# Patient Record
Sex: Male | Born: 1935 | Race: White | Hispanic: No | Marital: Married | Smoking: Never smoker
Health system: Southern US, Community
[De-identification: ages and names within clinical notes are randomized; demographics above are authoritative.]

## PROBLEM LIST (undated history)

## (undated) DIAGNOSIS — I10 Essential (primary) hypertension: Secondary | ICD-10-CM

## (undated) DIAGNOSIS — E039 Hypothyroidism, unspecified: Secondary | ICD-10-CM

## (undated) HISTORY — PX: TRANSURETHRAL RESECTION OF PROSTATE: SHX73

---

## 2014-09-13 DIAGNOSIS — F419 Anxiety disorder, unspecified: Secondary | ICD-10-CM | POA: Diagnosis present

## 2014-09-13 DIAGNOSIS — D509 Iron deficiency anemia, unspecified: Secondary | ICD-10-CM | POA: Diagnosis present

## 2016-02-25 DIAGNOSIS — E039 Hypothyroidism, unspecified: Secondary | ICD-10-CM | POA: Diagnosis present

## 2016-02-25 DIAGNOSIS — I10 Essential (primary) hypertension: Secondary | ICD-10-CM | POA: Diagnosis present

## 2016-02-26 DIAGNOSIS — Z79899 Other long term (current) drug therapy: Secondary | ICD-10-CM | POA: Diagnosis not present

## 2016-02-26 DIAGNOSIS — I10 Essential (primary) hypertension: Secondary | ICD-10-CM | POA: Diagnosis not present

## 2016-02-26 DIAGNOSIS — E782 Mixed hyperlipidemia: Secondary | ICD-10-CM | POA: Diagnosis not present

## 2016-02-26 DIAGNOSIS — F419 Anxiety disorder, unspecified: Secondary | ICD-10-CM | POA: Diagnosis not present

## 2016-02-26 DIAGNOSIS — D508 Other iron deficiency anemias: Secondary | ICD-10-CM | POA: Diagnosis not present

## 2016-02-26 DIAGNOSIS — E039 Hypothyroidism, unspecified: Secondary | ICD-10-CM | POA: Diagnosis not present

## 2016-02-26 DIAGNOSIS — M159 Polyosteoarthritis, unspecified: Secondary | ICD-10-CM | POA: Diagnosis not present

## 2016-08-26 DIAGNOSIS — D508 Other iron deficiency anemias: Secondary | ICD-10-CM | POA: Diagnosis not present

## 2016-08-26 DIAGNOSIS — Z79899 Other long term (current) drug therapy: Secondary | ICD-10-CM | POA: Diagnosis not present

## 2016-08-26 DIAGNOSIS — E782 Mixed hyperlipidemia: Secondary | ICD-10-CM | POA: Diagnosis not present

## 2016-08-26 DIAGNOSIS — F419 Anxiety disorder, unspecified: Secondary | ICD-10-CM | POA: Diagnosis not present

## 2016-08-26 DIAGNOSIS — Z0000 Encounter for general adult medical examination without abnormal findings: Secondary | ICD-10-CM | POA: Diagnosis not present

## 2016-08-26 DIAGNOSIS — E039 Hypothyroidism, unspecified: Secondary | ICD-10-CM | POA: Diagnosis not present

## 2016-08-26 DIAGNOSIS — I10 Essential (primary) hypertension: Secondary | ICD-10-CM | POA: Diagnosis not present

## 2016-09-10 DIAGNOSIS — R195 Other fecal abnormalities: Secondary | ICD-10-CM | POA: Diagnosis not present

## 2016-09-10 DIAGNOSIS — K921 Melena: Secondary | ICD-10-CM | POA: Diagnosis not present

## 2016-09-16 DIAGNOSIS — Z923 Personal history of irradiation: Secondary | ICD-10-CM | POA: Diagnosis not present

## 2016-09-16 DIAGNOSIS — K648 Other hemorrhoids: Secondary | ICD-10-CM | POA: Diagnosis not present

## 2016-09-16 DIAGNOSIS — Z85828 Personal history of other malignant neoplasm of skin: Secondary | ICD-10-CM | POA: Diagnosis not present

## 2016-09-16 DIAGNOSIS — Z8546 Personal history of malignant neoplasm of prostate: Secondary | ICD-10-CM | POA: Diagnosis not present

## 2016-09-16 DIAGNOSIS — Z9049 Acquired absence of other specified parts of digestive tract: Secondary | ICD-10-CM | POA: Diagnosis not present

## 2016-09-16 DIAGNOSIS — Z86010 Personal history of colonic polyps: Secondary | ICD-10-CM | POA: Diagnosis not present

## 2016-09-16 DIAGNOSIS — K921 Melena: Secondary | ICD-10-CM | POA: Diagnosis not present

## 2016-09-16 DIAGNOSIS — K5730 Diverticulosis of large intestine without perforation or abscess without bleeding: Secondary | ICD-10-CM | POA: Diagnosis not present

## 2016-09-16 DIAGNOSIS — K644 Residual hemorrhoidal skin tags: Secondary | ICD-10-CM | POA: Diagnosis not present

## 2016-11-12 DIAGNOSIS — Z86711 Personal history of pulmonary embolism: Secondary | ICD-10-CM | POA: Diagnosis not present

## 2016-11-12 DIAGNOSIS — M79601 Pain in right arm: Secondary | ICD-10-CM | POA: Diagnosis not present

## 2017-01-07 DIAGNOSIS — M7541 Impingement syndrome of right shoulder: Secondary | ICD-10-CM | POA: Diagnosis not present

## 2017-01-28 ENCOUNTER — Emergency Department (HOSPITAL_COMMUNITY): Payer: PPO

## 2017-01-28 ENCOUNTER — Inpatient Hospital Stay (HOSPITAL_COMMUNITY)
Admission: EM | Admit: 2017-01-28 | Discharge: 2017-02-03 | DRG: 054 | Disposition: A | Discharge status: Skilled Nursing Facility | Payer: PPO | Attending: Internal Medicine | Admitting: Internal Medicine

## 2017-01-28 ENCOUNTER — Encounter (HOSPITAL_COMMUNITY): Payer: Self-pay | Admitting: Family Medicine

## 2017-01-28 DIAGNOSIS — C719 Malignant neoplasm of brain, unspecified: Principal | ICD-10-CM | POA: Diagnosis present

## 2017-01-28 DIAGNOSIS — R269 Unspecified abnormalities of gait and mobility: Secondary | ICD-10-CM

## 2017-01-28 DIAGNOSIS — D509 Iron deficiency anemia, unspecified: Secondary | ICD-10-CM | POA: Diagnosis not present

## 2017-01-28 DIAGNOSIS — R29818 Other symptoms and signs involving the nervous system: Secondary | ICD-10-CM | POA: Diagnosis not present

## 2017-01-28 DIAGNOSIS — G936 Cerebral edema: Secondary | ICD-10-CM | POA: Diagnosis not present

## 2017-01-28 DIAGNOSIS — D496 Neoplasm of unspecified behavior of brain: Secondary | ICD-10-CM | POA: Diagnosis present

## 2017-01-28 DIAGNOSIS — G939 Disorder of brain, unspecified: Secondary | ICD-10-CM

## 2017-01-28 DIAGNOSIS — G9389 Other specified disorders of brain: Secondary | ICD-10-CM

## 2017-01-28 DIAGNOSIS — Z8546 Personal history of malignant neoplasm of prostate: Secondary | ICD-10-CM | POA: Diagnosis not present

## 2017-01-28 DIAGNOSIS — R4701 Aphasia: Secondary | ICD-10-CM | POA: Diagnosis present

## 2017-01-28 DIAGNOSIS — E039 Hypothyroidism, unspecified: Secondary | ICD-10-CM | POA: Diagnosis present

## 2017-01-28 DIAGNOSIS — Z7982 Long term (current) use of aspirin: Secondary | ICD-10-CM | POA: Diagnosis not present

## 2017-01-28 DIAGNOSIS — F419 Anxiety disorder, unspecified: Secondary | ICD-10-CM | POA: Diagnosis present

## 2017-01-28 DIAGNOSIS — I10 Essential (primary) hypertension: Secondary | ICD-10-CM | POA: Diagnosis present

## 2017-01-28 DIAGNOSIS — Z66 Do not resuscitate: Secondary | ICD-10-CM | POA: Diagnosis present

## 2017-01-28 DIAGNOSIS — R4781 Slurred speech: Secondary | ICD-10-CM | POA: Diagnosis not present

## 2017-01-28 DIAGNOSIS — I6789 Other cerebrovascular disease: Secondary | ICD-10-CM | POA: Diagnosis not present

## 2017-01-28 HISTORY — DX: Hypothyroidism, unspecified: E03.9

## 2017-01-28 HISTORY — DX: Essential (primary) hypertension: I10

## 2017-01-28 LAB — COMPREHENSIVE METABOLIC PANEL
ALBUMIN: 3.5 g/dL (ref 3.5–5.0)
ALK PHOS: 47 U/L (ref 38–126)
ALT: 16 U/L — AB (ref 17–63)
AST: 29 U/L (ref 15–41)
Anion gap: 7 (ref 5–15)
BUN: 21 mg/dL — AB (ref 6–20)
CHLORIDE: 103 mmol/L (ref 101–111)
CO2: 26 mmol/L (ref 22–32)
CREATININE: 1.06 mg/dL (ref 0.61–1.24)
Calcium: 9 mg/dL (ref 8.9–10.3)
GFR calc Af Amer: >60 mL/min (ref >60)
GFR calc non Af Amer: >60 mL/min (ref >60)
GLUCOSE: 117 mg/dL — AB (ref 65–99)
Potassium: 4.4 mmol/L (ref 3.5–5.1)
SODIUM: 136 mmol/L (ref 135–145)
Total Bilirubin: 1 mg/dL (ref 0.3–1.2)
Total Protein: 5.8 g/dL — ABNORMAL LOW (ref 6.5–8.1)

## 2017-01-28 LAB — I-STAT CHEM 8, ED
BUN: 26 mg/dL — AB (ref 6–20)
CHLORIDE: 101 mmol/L (ref 101–111)
CREATININE: 1.1 mg/dL (ref 0.61–1.24)
Calcium, Ion: 1.13 mmol/L — ABNORMAL LOW (ref 1.15–1.40)
Glucose, Bld: 117 mg/dL — ABNORMAL HIGH (ref 65–99)
HEMATOCRIT: 36 % — AB (ref 39.0–52.0)
HEMOGLOBIN: 12.2 g/dL — AB (ref 13.0–17.0)
POTASSIUM: 4.4 mmol/L (ref 3.5–5.1)
Sodium: 139 mmol/L (ref 135–145)
TCO2: 27 mmol/L (ref 0–100)

## 2017-01-28 LAB — CBC
HEMATOCRIT: 36.7 % — AB (ref 39.0–52.0)
Hemoglobin: 12.5 g/dL — ABNORMAL LOW (ref 13.0–17.0)
MCH: 28.9 pg (ref 26.0–34.0)
MCHC: 34.1 g/dL (ref 30.0–36.0)
MCV: 84.8 fL (ref 78.0–100.0)
Platelets: 186 10*3/uL (ref 150–400)
RBC: 4.33 MIL/uL (ref 4.22–5.81)
RDW: 12.8 % (ref 11.5–15.5)
WBC: 8.5 10*3/uL (ref 4.0–10.5)

## 2017-01-28 LAB — DIFFERENTIAL
BASOS ABS: 0 10*3/uL (ref 0.0–0.1)
BASOS PCT: 1 %
Eosinophils Absolute: 0 10*3/uL (ref 0.0–0.7)
Eosinophils Relative: 0 %
LYMPHS PCT: 7 %
Lymphs Abs: 0.6 10*3/uL — ABNORMAL LOW (ref 0.7–4.0)
Monocytes Absolute: 0.4 10*3/uL (ref 0.1–1.0)
Monocytes Relative: 5 %
NEUTROS ABS: 7.4 10*3/uL (ref 1.7–7.7)
Neutrophils Relative %: 87 %

## 2017-01-28 LAB — I-STAT TROPONIN, ED: Troponin i, poc: 0 ng/mL (ref 0.00–0.08)

## 2017-01-28 LAB — PROTIME-INR
INR: 1.07
PROTHROMBIN TIME: 13.9 s (ref 11.4–15.2)

## 2017-01-28 LAB — CBG MONITORING, ED: Glucose-Capillary: 86 mg/dL (ref 65–99)

## 2017-01-28 LAB — APTT: APTT: 28 s (ref 24–36)

## 2017-01-28 MED ORDER — ENOXAPARIN SODIUM 40 MG/0.4ML ~~LOC~~ SOLN
40.0000 mg | SUBCUTANEOUS | Status: DC
  Administered 2017-01-28 – 2017-02-03 (×5): 40 mg via SUBCUTANEOUS
  Filled 2017-01-28 (×5): qty 0.4

## 2017-01-28 MED ORDER — GADOBENATE DIMEGLUMINE 529 MG/ML IV SOLN
14.0000 mL | Freq: Once | INTRAVENOUS | Status: AC | PRN
  Administered 2017-01-28: 14 mL via INTRAVENOUS

## 2017-01-28 MED ORDER — ACETAMINOPHEN 650 MG RE SUPP: 650.0000 mg | Freq: Four times a day (QID) | RECTAL | Status: DC | PRN

## 2017-01-28 MED ORDER — SODIUM CHLORIDE 0.9 % IV SOLN
1000.0000 mg | Freq: Once | INTRAVENOUS | Status: AC
  Administered 2017-01-28: 1000 mg via INTRAVENOUS
  Filled 2017-01-28: qty 10

## 2017-01-28 MED ORDER — SODIUM CHLORIDE 0.9 % IV SOLN
500.0000 mg | Freq: Two times a day (BID) | INTRAVENOUS | Status: DC
  Administered 2017-01-29: 500 mg via INTRAVENOUS
  Filled 2017-01-28 (×2): qty 5

## 2017-01-28 MED ORDER — LORAZEPAM 2 MG/ML IJ SOLN
1.0000 mg | Freq: Once | INTRAMUSCULAR | Status: AC
  Administered 2017-01-28: 1 mg via INTRAVENOUS
  Filled 2017-01-28: qty 1

## 2017-01-28 MED ORDER — ONDANSETRON HCL 4 MG/2ML IJ SOLN: 4.0000 mg | Freq: Four times a day (QID) | INTRAMUSCULAR | Status: DC | PRN

## 2017-01-28 MED ORDER — LORAZEPAM 2 MG/ML IJ SOLN
1.0000 mg | Freq: Once | INTRAMUSCULAR | Status: AC
  Administered 2017-01-28: 1 mg via INTRAVENOUS

## 2017-01-28 MED ORDER — IBUPROFEN 200 MG PO TABS: 400.0000 mg | ORAL_TABLET | Freq: Four times a day (QID) | ORAL | Status: DC | PRN

## 2017-01-28 MED ORDER — IOPAMIDOL (ISOVUE-370) INJECTION 76%
INTRAVENOUS | Status: AC
  Filled 2017-01-28: qty 100

## 2017-01-28 MED ORDER — ACETAMINOPHEN 325 MG PO TABS: 650.0000 mg | ORAL_TABLET | Freq: Four times a day (QID) | ORAL | Status: DC | PRN

## 2017-01-28 MED ORDER — ONDANSETRON HCL 4 MG/2ML IJ SOLN
4.0000 mg | Freq: Once | INTRAMUSCULAR | Status: AC
  Administered 2017-01-28: 4 mg via INTRAVENOUS
  Filled 2017-01-28: qty 2

## 2017-01-28 MED ORDER — ONDANSETRON HCL 4 MG PO TABS: 4.0000 mg | ORAL_TABLET | Freq: Four times a day (QID) | ORAL | Status: DC | PRN

## 2017-01-28 MED ORDER — LORAZEPAM 2 MG/ML IJ SOLN
INTRAMUSCULAR | Status: AC
  Filled 2017-01-28: qty 1

## 2017-01-28 MED ORDER — SODIUM CHLORIDE 0.9 % IV SOLN
INTRAVENOUS | Status: DC
  Administered 2017-01-28 – 2017-01-29 (×3): via INTRAVENOUS

## 2017-01-28 NOTE — ED Notes (Signed)
Code Stroke paged out @ 1722.

## 2017-01-28 NOTE — ED Notes (Signed)
Patient transported to MRI 

## 2017-01-28 NOTE — Consult Note (Addendum)
Neurology Consultation Reason for Consult: Stroke Referring Physician: Thomasene Lot, C  CC: Aphasia  History is obtained from:patient  HPI: Calvin Berry is a 83 y.o. male who presents with acute onset aphasia. He was last seen well around noon and then subsequently found around 5pm by a neighbor not talking. He has apparently emerge during as a code stroke but is outside the IV TPA window.  On initial head CT, there is finding of what appears to be a mass in the left posterior quadrant. The acuity of onset is unusual and would make me wonder about possible seizure activity with persistent Todd's.  LKW: noon tpa given?: no, out of window    ROS:  Unable to obtain due to altered mental status.   PMH: Unable to assess secondary to patient's altered mental status.   FHx: Unable to assess secondary to patient's altered mental status.    Social History:  Unable to assess secondary to patient's altered mental status.    Exam: Current vital signs: There were no vitals taken for this visit. Vital signs in last 24 hours: BP: ()/()  Arterial Line BP: ()/()    Physical Exam  Constitutional: Appears well-developed and well-nourished.  Psych: Affect appropriate to situation Eyes: No scleral injection HENT: No OP obstrucion Head: Normocephalic.  Cardiovascular: Normal rate and regular rhythm.  Respiratory: Effort normal and breath sounds normal to anterior ascultation GI: Soft.  No distension. There is no tenderness.  Skin: WDI  Neuro: Mental Status: Patient is awake, alert, he has a prominent fluent aphasia Cranial Nerves: II: He has a right hemianopia Pupils are equal, round, and reactive to light.   III,IV, VI: EOMI without ptosis or diploplia.  V: Facial sensation is symmetric to temperature VII: Facial movement is symmetric.  VIII: hearing is intact to voice X: Uvula elevates symmetrically XI: Shoulder shrug is symmetric. XII: tongue is midline without atrophy or  fasciculations.  Motor: Tone is normal. Bulk is normal. 5/5 strength was present in all four extremities.  Sensory: He responds to noxious stimulation bilaterally Cerebellar: He does not comply, but no clear ataxia is seen   I have reviewed labs in epic and the results pertinent to this consultation are: CMP-unremarkable  I have reviewed the images obtained: CT head-hypodensity in the left parietotemporal region with some likely edema surrounding it.  Impression: 83 year old male with new onset aphasia in the setting of likely intercranial mass. Possible that the edema simply got to the point today where he became symptomatic, but I do wonder if he might of had a seizure with his current aphasia being a postictal Todd's.  Recommendations: 1) Keppra 1 g, then 500 mg twice a day 2) MRI brain w/wo contrast 3) further recommendations depending on MRI brain 4) EEG  Roland Rack, MD [facility] [phone]  If 7pm- 7am, please page neurology on call as listed in [place].

## 2017-01-28 NOTE — ED Provider Notes (Signed)
[facility] DEPT Provider Note   CSN: [id number] Arrival date & time: 01/28/17  1741     History   Chief Complaint Chief Complaint  Patient presents with  . Code Stroke    HPI Calvin Berry is a 83 y.o. male.  HPI   Patient is a 83 year old male presenting with code stroke. Patient last seen normal at noon. Patient was found to have difficulty speaking and difficulty understanding. Called code stroke. Had a CAT scan. CAT scan showed questionable mass in the temporal lobe.    Level V caveat altered mental status. Past Medical History:  Diagnosis Date  . Hypertension   . Hypothyroidism     Patient Active Problem List   Diagnosis Date Noted  . Aphasia 01/28/2017  . Brain mass 01/28/2017  . Essential hypertension 02/25/2016  . Hypothyroidism, acquired 02/25/2016  . Anxiety disorder 09/13/2014  . IDA (iron deficiency anemia) 09/13/2014    Past Surgical History:  Procedure Laterality Date  . TRANSURETHRAL RESECTION OF PROSTATE         Home Medications    Prior to Admission medications   Medication Sig Start Date End Date Taking? Authorizing Provider  aspirin 325 MG tablet Take 325 mg by mouth daily.   Yes Historical Provider, MD  Cyanocobalamin (VITAMIN B12 PO) Take by mouth.   Yes Historical Provider, MD  levothyroxine (SYNTHROID, LEVOTHROID) 50 MCG tablet Take 50 mcg by mouth daily. 11/07/16  Yes Historical Provider, MD  Menthol-Methyl Salicylate (MUSCLE RUB EX) Apply topically.   Yes Historical Provider, MD  Multiple Vitamin (MULTIVITAMIN WITH MINERALS) TABS tablet Take 1 tablet by mouth daily.   Yes Historical Provider, MD  Omega-3 Fatty Acids (FISH OIL) 1000 MG CAPS Take by mouth.   Yes Historical Provider, MD  ALPRAZolam Duanne Moron) 0.5 MG tablet Take 0.5 mg by mouth at bedtime as needed for sleep. 11/18/16   Historical Provider, MD  aspirin EC 81 MG tablet Take 81 mg by mouth daily.    Historical Provider, MD    Family History Family History    Problem Relation Age of Onset  . Other Brother     Social History Social History  Substance Use Topics  . Smoking status: Never Smoker  . Smokeless tobacco: Never Used  . Alcohol use Not on file     Allergies   Statins   Review of Systems Review of Systems  Unable to perform ROS: Mental status change     Physical Exam Updated Vital Signs BP (!) 166/97 (BP Location: Right Arm)   Pulse (!) 109   Temp 98.5 F (36.9 C) (Oral)   Resp (!) 22   Ht 5\' 7"  (1.702 m)   Wt 145 lb 11.2 oz (66.1 kg)   SpO2 96%   BMI 22.82 kg/m   Physical Exam  Constitutional: He appears well-nourished.  HENT:  Head: Normocephalic.  Eyes: Conjunctivae are normal. Right eye exhibits no discharge. Left eye exhibits no discharge.  Neck: Normal range of motion.  Cardiovascular: Normal rate and regular rhythm.   Pulmonary/Chest: Effort normal and breath sounds normal.  Neurological:  Global aophasia.  Alert  R arm weakness.  Skin: Skin is warm and dry. He is not diaphoretic.  Psychiatric: He has a normal mood and affect. His behavior is normal.     ED Treatments / Results  Labs (all labs ordered are listed, but only abnormal results are displayed) Labs Reviewed  CBC - Abnormal; Notable for the following:  Result Value   Hemoglobin 12.5 (*)    HCT 36.7 (*)    All other components within normal limits  DIFFERENTIAL - Abnormal; Notable for the following:    Lymphs Abs 0.6 (*)    All other components within normal limits  COMPREHENSIVE METABOLIC PANEL - Abnormal; Notable for the following:    Glucose, Bld 117 (*)    BUN 21 (*)    Total Protein 5.8 (*)    ALT 16 (*)    All other components within normal limits  I-STAT CHEM 8, ED - Abnormal; Notable for the following:    BUN 26 (*)    Glucose, Bld 117 (*)    Calcium, Ion 1.13 (*)    Hemoglobin 12.2 (*)    HCT 36.0 (*)    All other components within normal limits  PROTIME-INR  APTT  BASIC METABOLIC PANEL  I-STAT TROPOININ,  ED  CBG MONITORING, ED    EKG  EKG Interpretation  Date/Time:  Wednesday January 28 2017 18:37:14 EST Ventricular Rate:  88 PR Interval:    QRS Duration: 93 QT Interval:  365 QTC Calculation: 442 R Axis:   69 Text Interpretation:  Sinus rhythm Consider left ventricular hypertrophy Borderline T abnormalities, inferior leads Normal sinus rhythm Confirmed by Gerald Leitz ([id number]) on 01/28/2017 7:07:26 PM       Radiology Mr Jeri Cos And Wo Contrast  Result Date: 01/28/2017 CLINICAL DATA:  83 y/o  M; left parietal brain lesion. EXAM: MRI HEAD WITHOUT AND WITH CONTRAST TECHNIQUE: Multiplanar, multiecho pulse sequences of the brain and surrounding structures were obtained without and with intravenous contrast. CONTRAST:  32mL MULTIHANCE GADOBENATE DIMEGLUMINE 529 MG/ML IV SOLN COMPARISON:  01/28/2017 and 03/26/2011 CT of the head. FINDINGS: Brain: No diffusion signal abnormality to suggest acute or early subacute infarct. Background of few foci of T2 FLAIR hyperintense signal abnormality in white matter compatible with mild chronic microvascular ischemic changes and mild brain parenchymal volume loss. No hydrocephalus. No extra-axial collection. Mass lesion in the left parietal lobe subcortical white matter measuring approximately 18 x 21 x 16 mm (AP x ML x CC series 5, image 19 and series 12, image 36). The mass is mildly T2 hyperintense and predominantly T1 hypointense. There are foci of T1 hyperintensity within the lesion with faint susceptibility blooming corresponding to calcifications on the prior CT of the head. After administration of intravenous contrast there is no appreciable enhancement. Within white matter surrounding the mass in the left parietal lobe there is T2 FLAIR hyperintense signal abnormality. The overlying cortical ribbon is intact. Vascular: Normal flow voids. Skull and upper cervical spine: Normal marrow signal. Sinuses/Orbits: No acute finding. Bilateral intra-ocular lens  replacement. Other: None. IMPRESSION: Mass lesion in the left parietal subcortical white matter with mineralization, gyral expansion, and small surrounding region of edema. No appreciable enhancement. No diffusion restriction. Intact overlying cortical ribbon. Findings are atypical for infarction or hemorrhage and probably represent neoplasm such as a hypoenhancing metastasis or glial/oligoglial neoplasm. Electronically Signed   By: Kristine Garbe M.D.   On: 01/28/2017 21:34   Ct Head Code Stroke W/o Cm  Result Date: 01/28/2017 CLINICAL DATA:  Code stroke. Acute presentation with speech disturbance and visual disturbance. EXAM: CT HEAD WITHOUT CONTRAST TECHNIQUE: Contiguous axial images were obtained from the base of the skull through the vertex without intravenous contrast. COMPARISON:  Head CT 03/26/2011 FINDINGS: Brain: Brainstem and cerebellum are unremarkable. Right cerebral hemisphere appears normal for age with mild age related volume loss.  On the left, there is a region of low density in the parietal region affecting the cortical and subcortical brain with areas of linear an indistinct hyperdensity, some of which is in the range highly suggestive of calcification rather than hemorrhage. Therefore, I am concerned about the possibility of a tumor in this location. The differential diagnosis would be recent infarction with mild hemorrhage, but that is not favored based on this CT. MRI would be suggested. Vascular: There is atherosclerotic calcification of the major vessels at the base of the brain. No focal hyperdense vessels. Skull: Negative Sinuses/Orbits: Clear/normal Other: None significant ASPECTS ([place] Stroke Program Early CT Score) Aspects not actually applicable in this case with suspected tumor. IMPRESSION: 1. Low-density in the left parietal region with linear and indistinct hyperdensity. This is favored to represent a mass lesion with calcification. The differential diagnosis does  include recent infarction with hemorrhage, but that is not favored. MRI suggested. 2. These results were discussed by telephone at the time of interpretation on 01/28/2017 at 6:00 pm to Dr. Leonel Ramsay, who verbally acknowledged these results. Electronically Signed   By: Nelson Chimes M.D.   On: 01/28/2017 18:07    Procedures Procedures (including critical care time)  Medications Ordered in ED Medications  enoxaparin (LOVENOX) injection 40 mg (40 mg Subcutaneous Given 01/28/17 2257)  0.9 %  sodium chloride infusion ( Intravenous New Bag/Given 01/28/17 2258)  acetaminophen (TYLENOL) tablet 650 mg (not administered)    Or  acetaminophen (TYLENOL) suppository 650 mg (not administered)  ibuprofen (ADVIL,MOTRIN) tablet 400 mg (not administered)  ondansetron (ZOFRAN) tablet 4 mg (not administered)    Or  ondansetron (ZOFRAN) injection 4 mg (not administered)  levETIRAcetam (KEPPRA) 500 mg in sodium chloride 0.9 % 100 mL IVPB (not administered)  LORazepam (ATIVAN) injection 1 mg (1 mg Intravenous Given 01/28/17 1812)  levETIRAcetam (KEPPRA) 1,000 mg in sodium chloride 0.9 % 100 mL IVPB (0 mg Intravenous Stopped 01/28/17 1836)  ondansetron (ZOFRAN) injection 4 mg (4 mg Intravenous Given 01/28/17 1812)  gadobenate dimeglumine (MULTIHANCE) injection 14 mL (14 mLs Intravenous Contrast Given 01/28/17 2037)  LORazepam (ATIVAN) injection 1 mg (1 mg Intravenous Given 01/28/17 2103)     Initial Impression / Assessment and Plan / ED Course  I have reviewed the triage vital signs and the nursing notes.  Pertinent labs & imaging results that were available during my care of the patient were reviewed by me and considered in my medical decision making (see chart for details).     Patient is 83 year old male presenting with code stroke. Patient has global aphasia. Mass on CT. Discussed with Dr. Leonel Ramsay. He recommends Keppra loading with 1 milligram of Ativan as well. Unclear right now whether this is a Todd  paralysis and aphasia secondary to seizure versus mass effect. We'll get MRI, plan to admit to medicine with both neurology and neurosurgery following.    Final Clinical Impressions(s) / ED Diagnoses   Final diagnoses:  Aphasia    New Prescriptions Current Discharge Medication List       Jinger Middlesworth Julio Alm, MD 01/28/17 2350

## 2017-01-28 NOTE — Code Documentation (Signed)
82 y.o. Male presents to [facility] via [facility] as code stroke. EMS reports the patient was in his normal state of health today at 1200 when he visited his wife today at her house.The patient then left and ended up driving home. While at home, the patient then started to feel bad. He then went to his neighbors house who then took him back to his wife's house. Once at his wife's house, EMS was called. EMS reports slurred speech, word substitution and RUE hemiparesis. Upon arrival to [facility], labs drawn and patient taken to CT. CT showing low-density in the left parietal region with linear and indistinct hyperdensity. This is favored to represent a mass lesion with calcification. The differential diagnosis does include recent infarction with hemorrhage, but that is not favored. CTA and CTP considered but not performed d/t neuro MD and neuro rad discussion. MRI pending. NIHSS 9. See EMR for NIHSS and code stroke times. tPA not given d/t being out of the window. Patient's son is at bedside. The son reports right arm pain that has persisted for about 6 weeks.  Pt currently globally aphasic and unable to follow verbal commands, dysarthric, and confused. Bedside handoff with ED RN Luellen Pucker

## 2017-01-28 NOTE — H&P (Signed)
History and Physical  Patient Name: Calvin Berry     SWN:[id number]    DOB: 1930/04/22    DOA: 01/28/2017 PCP: Gilford Rile, MD   Patient coming from: Home  Chief Complaint: Aphasia  HPI: Calvin Berry is a 83 y.o. male with a past medical history significant for HTN not on medication, and hypothyroidism who presents with aphasia.  Caveat that patient is unable to provide history, so all history collected from chart.  The patient was in his usual state of health today and LSN around noon by his wife, and then around 5PM was encountered by a friend who noticed he was "not talking" and "having difficulty speaking", and so he was brought in as a [place].    ED course: -Heart rate 87, respirations and pulse ox normal, BP 174/95 -Na 136, K 4.4, Cr 1.06, WBC 8.5K, Hgb 12.5 -Coags normal -Troponin negative -CT head showed an indistinct L parietal lesion, favored to be a brain mass -Patient was evaluated by Neurology, loaded with Keppra, and MRI with and without contrast was obtained and TRH were asked to observe overnight to formulate plan after MRI     ROS: Review of Systems  Unable to perform ROS: Language          Past Medical History:  Diagnosis Date  . Hypertension   . Hypothyroidism     Past Surgical History:  Procedure Laterality Date  . TRANSURETHRAL RESECTION OF PROSTATE      Social History: Patient unable to provide.  Allergies  Allergen Reactions  . Statins Other (See Comments)    Unknown    Family history: Patient unable to provide  Prior to Admission medications   Medication Sig Start Date End Date Taking? Authorizing Provider  ALPRAZolam Duanne Moron) 0.5 MG tablet Take 0.5 mg by mouth at bedtime as needed for sleep. 11/18/16  Yes Historical Provider, MD  aspirin EC 81 MG tablet Take 81 mg by mouth daily.   Yes Historical Provider, MD  levothyroxine (SYNTHROID, LEVOTHROID) 50 MCG tablet Take 50 mcg by mouth daily. 11/07/16  Yes Historical Provider,  MD       Physical Exam: BP 163/96   Pulse 98   Resp 18   Ht '5\' 7"'$  (1.702 m)   Wt 65.7 kg (144 lb 13.5 oz)   SpO2 97%   BMI 22.69 kg/m  General appearance: Well-developed, adult male, sedated after MRI.   Eyes: Anicteric, conjunctiva pink, lids and lashes normal. PERRL.    ENT: No nasal deformity, discharge, epistaxis.  Hearingunable to test. OP moist without lesions.   Neck: No neck masses.  Trachea midline.  No thyromegaly/tenderness. Lymph: No cervical or supraclavicular lymphadenopathy. Skin: Warm and dry.  No jaundice.  No suspicious rashes or lesions. Cardiac: RRR, nl S1-S2, no murmurs appreciated.   Respiratory: Normal respiratory rate and rhythm.  CTAB without rales or wheezes. Abdomen: Abdomen soft.  NO TTP. No ascites, distension, hepatosplenomegaly.   MSK: No deformities or effusions.  No cyanosis or clubbing. Neuro: Somnolent, PERRL. Does not follow commands.    Psych: Unable to assess.     Labs on Admission:  I have personally reviewed following labs and imaging studies: CBC:  Recent Labs Lab 01/28/17 1744 01/28/17 1749  WBC 8.5  --   NEUTROABS 7.4  --   HGB 12.5* 12.2*  HCT 36.7* 36.0*  MCV 84.8  --   PLT 186  --    Basic Metabolic Panel:  Recent Labs Lab 01/28/17 1744 01/28/17  1749  NA 136 139  K 4.4 4.4  CL 103 101  CO2 26  --   GLUCOSE 117* 117*  BUN 21* 26*  CREATININE 1.06 1.10  CALCIUM 9.0  --    GFR: Estimated Creatinine Clearance: 46.5 mL/min (by C-G formula based on SCr of 1.1 mg/dL).  Liver Function Tests:  Recent Labs Lab 01/28/17 1744  AST 29  ALT 16*  ALKPHOS 47  BILITOT 1.0  PROT 5.8*  ALBUMIN 3.5   No results for input(s): LIPASE, AMYLASE in the last 168 hours. No results for input(s): AMMONIA in the last 168 hours. Coagulation Profile:  Recent Labs Lab 01/28/17 1744  INR 1.07   Cardiac Enzymes: No results for input(s): CKTOTAL, CKMB, CKMBINDEX, TROPONINI in the last 168 hours. BNP (last 3 results) No  results for input(s): PROBNP in the last 8760 hours. HbA1C: No results for input(s): HGBA1C in the last 72 hours. CBG:  Recent Labs Lab 01/28/17 1907  GLUCAP 86   Lipid Profile: No results for input(s): CHOL, HDL, LDLCALC, TRIG, CHOLHDL, LDLDIRECT in the last 72 hours. Thyroid Function Tests: No results for input(s): TSH, T4TOTAL, FREET4, T3FREE, THYROIDAB in the last 72 hours. Anemia Panel: No results for input(s): VITAMINB12, FOLATE, FERRITIN, TIBC, IRON, RETICCTPCT in the last 72 hours. Sepsis Labs: Invalid input(s): PROCALCITONIN, LACTICIDVEN No results found for this or any previous visit (from the past 240 hour(s)).       Radiological Exams on Admission: Personally reviewed: Ct Head Code Stroke W/o Cm  Result Date: 01/28/2017 CLINICAL DATA:  Code stroke. Acute presentation with speech disturbance and visual disturbance. EXAM: CT HEAD WITHOUT CONTRAST TECHNIQUE: Contiguous axial images were obtained from the base of the skull through the vertex without intravenous contrast. COMPARISON:  Head CT 03/26/2011 FINDINGS: Brain: Brainstem and cerebellum are unremarkable. Right cerebral hemisphere appears normal for age with mild age related volume loss. On the left, there is a region of low density in the parietal region affecting the cortical and subcortical brain with areas of linear an indistinct hyperdensity, some of which is in the range highly suggestive of calcification rather than hemorrhage. Therefore, I am concerned about the possibility of a tumor in this location. The differential diagnosis would be recent infarction with mild hemorrhage, but that is not favored based on this CT. MRI would be suggested. Vascular: There is atherosclerotic calcification of the major vessels at the base of the brain. No focal hyperdense vessels. Skull: Negative Sinuses/Orbits: Clear/normal Other: None significant ASPECTS ([place] Stroke Program Early CT Score) Aspects not actually applicable in this  case with suspected tumor. IMPRESSION: 1. Low-density in the left parietal region with linear and indistinct hyperdensity. This is favored to represent a mass lesion with calcification. The differential diagnosis does include recent infarction with hemorrhage, but that is not favored. MRI suggested. 2. These results were discussed by telephone at the time of interpretation on 01/28/2017 at 6:00 pm to Dr. Leonel Ramsay, who verbally acknowledged these results. Electronically Signed   By: Nelson Chimes M.D.   On: 01/28/2017 18:07    EKG: Independently reviewed. Rate 88, QTc 442, nonspicific isolated TW changes in III.    Assessment/Plan  1. Brain Mass:  Suspect either primary malignancy or metastasis.  Has hx of prostate CA although odd to have sinlge met from this.    -CT chest abdomen and pelvis with contrast -Check PSA -If CT imaging negative for primary, will d/w Neuro and Neuro surg re: biopsy  2. Hypertension:  Hypertnsive  at admission Not on meds per family.  3. Hypothyroidism:  -Continue levothyroxine        Code Status: FULL  Family Communication: Son and wife  Disposition Plan: Anticipate begin workup of brain mass Consults called: Neuro Admission status: OBS At the point of initial evaluation, it is my clinical opinion that admission for OBSERVATION is reasonable and necessary because the patient's presenting complaints in the context of their chronic conditions represent sufficient risk of deterioration or significant morbidity to constitute reasonable grounds for close observation in the hospital setting, but that the patient may be medically stable for discharge from the hospital within 24 to 48 hours.    Medical decision making: Patient seen at 8:53 PM on 01/28/2017.  The patient was discussed with Dr. Thomasene Lot.  What exists of the patient's chart was reviewed in depth and summarized above.  Clinical condition: stable.        Edwin Dada [facility] Pager [phone]

## 2017-01-28 NOTE — ED Notes (Signed)
MRI called for family member - walked patient's wife and son back so tech could ask questions r/e patient's history. Family staying over there while patient is in MRI.

## 2017-01-28 NOTE — ED Notes (Signed)
MRI called, stating patient is waking up more and moving around. Dr. Loleta Books made aware - see new orders.

## 2017-01-28 NOTE — ED Triage Notes (Signed)
PT last seen normal at 1200 by wife. Pt found by Friend approx. 1  .5 hrs ago. Pt was having difficulty speaking . The friend took PT to wife's house. EMS was called to transport .

## 2017-01-29 ENCOUNTER — Observation Stay (HOSPITAL_BASED_OUTPATIENT_CLINIC_OR_DEPARTMENT_OTHER)
Admit: 2017-01-29 | Discharge: 2017-01-29 | Disposition: A | Discharge status: Home / Self Care | Payer: PPO | Attending: Family Medicine | Admitting: Family Medicine

## 2017-01-29 ENCOUNTER — Observation Stay (HOSPITAL_COMMUNITY): Payer: PPO

## 2017-01-29 DIAGNOSIS — D509 Iron deficiency anemia, unspecified: Secondary | ICD-10-CM | POA: Diagnosis not present

## 2017-01-29 DIAGNOSIS — E039 Hypothyroidism, unspecified: Secondary | ICD-10-CM | POA: Diagnosis not present

## 2017-01-29 DIAGNOSIS — G9389 Other specified disorders of brain: Secondary | ICD-10-CM | POA: Diagnosis not present

## 2017-01-29 DIAGNOSIS — C719 Malignant neoplasm of brain, unspecified: Secondary | ICD-10-CM | POA: Diagnosis not present

## 2017-01-29 DIAGNOSIS — G936 Cerebral edema: Secondary | ICD-10-CM | POA: Diagnosis not present

## 2017-01-29 DIAGNOSIS — I10 Essential (primary) hypertension: Secondary | ICD-10-CM | POA: Diagnosis not present

## 2017-01-29 DIAGNOSIS — R440 Auditory hallucinations: Secondary | ICD-10-CM | POA: Diagnosis not present

## 2017-01-29 DIAGNOSIS — Z66 Do not resuscitate: Secondary | ICD-10-CM | POA: Diagnosis not present

## 2017-01-29 DIAGNOSIS — D496 Neoplasm of unspecified behavior of brain: Secondary | ICD-10-CM | POA: Diagnosis not present

## 2017-01-29 DIAGNOSIS — Z8546 Personal history of malignant neoplasm of prostate: Secondary | ICD-10-CM | POA: Diagnosis not present

## 2017-01-29 DIAGNOSIS — N200 Calculus of kidney: Secondary | ICD-10-CM | POA: Diagnosis not present

## 2017-01-29 DIAGNOSIS — R4701 Aphasia: Secondary | ICD-10-CM

## 2017-01-29 DIAGNOSIS — Z7982 Long term (current) use of aspirin: Secondary | ICD-10-CM | POA: Diagnosis not present

## 2017-01-29 DIAGNOSIS — F419 Anxiety disorder, unspecified: Secondary | ICD-10-CM | POA: Diagnosis not present

## 2017-01-29 DIAGNOSIS — R918 Other nonspecific abnormal finding of lung field: Secondary | ICD-10-CM | POA: Diagnosis not present

## 2017-01-29 DIAGNOSIS — R531 Weakness: Secondary | ICD-10-CM | POA: Diagnosis not present

## 2017-01-29 LAB — BASIC METABOLIC PANEL
ANION GAP: 6 (ref 5–15)
BUN: 17 mg/dL (ref 6–20)
CHLORIDE: 104 mmol/L (ref 101–111)
CO2: 26 mmol/L (ref 22–32)
Calcium: 8.3 mg/dL — ABNORMAL LOW (ref 8.9–10.3)
Creatinine, Ser: 0.96 mg/dL (ref 0.61–1.24)
GFR calc non Af Amer: >60 mL/min (ref >60)
Glucose, Bld: 104 mg/dL — ABNORMAL HIGH (ref 65–99)
POTASSIUM: 3.8 mmol/L (ref 3.5–5.1)
SODIUM: 136 mmol/L (ref 135–145)

## 2017-01-29 LAB — PSA: PSA: 0.75 ng/mL (ref 0.00–4.00)

## 2017-01-29 MED ORDER — LORAZEPAM 2 MG/ML IJ SOLN
0.5000 mg | Freq: Once | INTRAMUSCULAR | Status: AC
  Administered 2017-01-29: 0.5 mg via INTRAVENOUS
  Filled 2017-01-29: qty 1

## 2017-01-29 MED ORDER — IOPAMIDOL (ISOVUE-300) INJECTION 61%
INTRAVENOUS | Status: AC
  Administered 2017-01-29: 100 mL via INTRAVENOUS
  Filled 2017-01-29: qty 100

## 2017-01-29 MED ORDER — DIAZEPAM 5 MG PO TABS: 10.0000 mg | ORAL_TABLET | Freq: Once | ORAL | Status: DC

## 2017-01-29 MED ORDER — SODIUM CHLORIDE 0.9 % IV SOLN
750.0000 mg | Freq: Two times a day (BID) | INTRAVENOUS | Status: DC
  Administered 2017-01-29 – 2017-02-03 (×10): 750 mg via INTRAVENOUS
  Filled 2017-01-29 (×12): qty 7.5

## 2017-01-29 MED ORDER — DEXAMETHASONE SODIUM PHOSPHATE 10 MG/ML IJ SOLN
4.0000 mg | Freq: Four times a day (QID) | INTRAMUSCULAR | Status: DC
  Administered 2017-01-29 – 2017-02-01 (×12): 4 mg via INTRAVENOUS
  Filled 2017-01-29 (×14): qty 1

## 2017-01-29 NOTE — Progress Notes (Signed)
Subjective: Patient is much improved, though still with aphasia.  Exam: Vitals:   01/29/17 0524 01/29/17 1026  BP: 136/84 132/77  Pulse: 95 88  Resp: 20 20  Temp: 98.4 F (36.9 C) 97 F (36.1 C)   Gen: In bed, NAD Resp: non-labored breathing, no acute distress Abd: soft, nt  Neuro: MS: awake, alert, both receptive and expressive aphasia.  CN: Does not participate well with VF testing. Face symmetric Motor: he moves all extremities well.  Sensory:intact to LT  Pertinent Labs: BMP - unremarkable  No ongoing sz on EEG.   Impression: 83 yo M with left parietal mass. I suspect he had a seizure yesterday, and possibly increased edema as a cause for his aphasia continuing.   Recommendations: 1) Agree with dexamethasone 2) Continue keppra 750mg  BID 3) nsgy consult.   Roland Rack, MD [facility] [phone]  If 7pm- 7am, please page neurology on call as listed in [place].

## 2017-01-29 NOTE — Procedures (Signed)
ELECTROENCEPHALOGRAM REPORT  Date of Study: 01/29/2017  Patient's Name: Calvin Berry MRN: [id number] Date of Birth: 1930/08/18  Referring Provider: Dr. Myrene Buddy  Clinical History: This is an 83 year old man with aphasia.  Medications: levETIRAcetam (KEPPRA) 500 mg in sodium chloride 0.9 % 100 mL IVPB  acetaminophen (TYLENOL) tablet 650 mg  enoxaparin (LOVENOX) injection 40 mg  ibuprofen (ADVIL,MOTRIN) tablet 400 mg   Technical Summary: A multichannel digital EEG recording measured by the international 10-20 system with electrodes applied with paste and impedances below 5000 ohms performed in our laboratory with EKG monitoring in a predominantly drowsy and asleep patient.  Hyperventilation and photic stimulation were not performed.  The digital EEG was referentially recorded, reformatted, and digitally filtered in a variety of bipolar and referential montages for optimal display.    Description: The patient is predominantly drowsy and asleep during the recording.  During brief period of wakefulness, there is a asymmetric, medium voltage 10 Hz posterior dominant rhythm better formed over the right occipital region that poorly attenuates with eye opening and eye closure. There is occasional focal 3-4 Hz slowing over the left temporoparietooccipital region. During drowsiness and sleep, there is an increase in theta slowing of the background with poorly formed vertex waves and sleep spindles were seen.  Hyperventilation and photic stimulation were not performed.  There were no epileptiform discharges or electrographic seizures seen.    EKG lead showed frequent extrasystolic beats.  Impression: This awake and asleep EEG is abnormal due to occasional focal slowing over the left temporoparietooccipital region.  Clinical Correlation of the above findings indicates focal cerebral dysfunction over the  left temporoparietooccipital region suggestive of underlying structural or  physiologic abnormality. The absence of epileptiform discharges does not exclude a clinical diagnosis of epilepsy. Clinical correlation is advised.   Ellouise Newer, M.D.

## 2017-01-29 NOTE — Progress Notes (Signed)
SLP Cancellation Note  Patient Details Name: LAWAN HITCHINS MRN: [id number] DOB: 1930-06-16   Cancelled treatment:       Reason Eval/Treat Not Completed: Patient at procedure or test/unavailable. Will f/u as able for swallow evaluation.   [name] 01/29/2017, 9:11 AM  [name], M.A. CCC-SLP [phone]

## 2017-01-29 NOTE — Consult Note (Signed)
Reason for Consult: Brain mass left parietal Referring Physician: [name] is an 83 y.o. male.  HPI: Calvin Berry is an 83 year old individual who apparently yesterday was found not to be talking. Is brought to the emergency department as a code stroke and it was noted that he had a mass in his left parietal region. Calvin Berry had an MRI which was of poor quality secondary to motion degradation but it demonstrates a lesion measuring approximately 2 cm in the left parietal region. There is a substantial amount of edema around this lesion and this appears to be most consistent with either a metastatic lesion or primary brain tumor. The location of this tumor near the postcentral region and the association cortex on the left side has significantly affected his speech. He also appears to be affecting his comprehension.  Currently no family is available to discuss this Calvin Berry's situation with him. Ultimately may require a biopsy as her suspicions are that this may be a high-grade lesion having induced such as change so rapidly.  Past Medical History:  Diagnosis Date  . Hypertension   . Hypothyroidism     Past Surgical History:  Procedure Laterality Date  . TRANSURETHRAL RESECTION OF PROSTATE      Family History  Problem Relation Age of Onset  . Other Brother     Social History:  reports that he has never smoked. He has never used smokeless tobacco. His alcohol and drug histories are not on file.  Allergies:  Allergies  Allergen Reactions  . Statins Other (See Comments)    Unknown    Medications: I have not reviewed the Calvin Berry's medications  Results for orders placed or performed during the hospital encounter of 01/28/17 (from the past 48 hour(s))  Protime-INR     Status: None   Collection Time: 01/28/17  5:44 PM  Result Value Ref Range   Prothrombin Time 13.9 11.4 - 15.2 seconds   INR 1.07   APTT     Status: None   Collection Time: 01/28/17  5:44 PM  Result Value  Ref Range   aPTT 28 24 - 36 seconds  CBC     Status: Abnormal   Collection Time: 01/28/17  5:44 PM  Result Value Ref Range   WBC 8.5 4.0 - 10.5 K/uL   RBC 4.33 4.22 - 5.81 MIL/uL   Hemoglobin 12.5 (L) 13.0 - 17.0 g/dL   HCT 36.7 (L) 39.0 - 52.0 %   MCV 84.8 78.0 - 100.0 fL   MCH 28.9 26.0 - 34.0 pg   MCHC 34.1 30.0 - 36.0 g/dL   RDW 12.8 11.5 - 15.5 %   Platelets 186 150 - 400 K/uL  Differential     Status: Abnormal   Collection Time: 01/28/17  5:44 PM  Result Value Ref Range   Neutrophils Relative % 87 %   Neutro Abs 7.4 1.7 - 7.7 K/uL   Lymphocytes Relative 7 %   Lymphs Abs 0.6 (L) 0.7 - 4.0 K/uL   Monocytes Relative 5 %   Monocytes Absolute 0.4 0.1 - 1.0 K/uL   Eosinophils Relative 0 %   Eosinophils Absolute 0.0 0.0 - 0.7 K/uL   Basophils Relative 1 %   Basophils Absolute 0.0 0.0 - 0.1 K/uL  Comprehensive metabolic panel     Status: Abnormal   Collection Time: 01/28/17  5:44 PM  Result Value Ref Range   Sodium 136 135 - 145 mmol/L   Potassium 4.4 3.5 - 5.1 mmol/L  Chloride 103 101 - 111 mmol/L   CO2 26 22 - 32 mmol/L   Glucose, Bld 117 (H) 65 - 99 mg/dL   BUN 21 (H) 6 - 20 mg/dL   Creatinine, Ser 1.06 0.61 - 1.24 mg/dL   Calcium 9.0 8.9 - 10.3 mg/dL   Total Protein 5.8 (L) 6.5 - 8.1 g/dL   Albumin 3.5 3.5 - 5.0 g/dL   AST 29 15 - 41 U/L   ALT 16 (L) 17 - 63 U/L   Alkaline Phosphatase 47 38 - 126 U/L   Total Bilirubin 1.0 0.3 - 1.2 mg/dL   GFR calc non Af Amer >60 >60 mL/min   GFR calc Af Amer >60 >60 mL/min    Comment: (NOTE) The eGFR has been calculated using the CKD EPI equation. This calculation has not been validated in all clinical situations. eGFR's persistently <60 mL/min signify possible Chronic Kidney Disease.    Anion gap 7 5 - 15  I-stat troponin, ED     Status: None   Collection Time: 01/28/17  5:47 PM  Result Value Ref Range   Troponin i, poc 0.00 0.00 - 0.08 ng/mL   Comment 3            Comment: Due to the release kinetics of cTnI, a  negative result within the first hours of the onset of symptoms does not rule out myocardial infarction with certainty. If myocardial infarction is still suspected, repeat the test at appropriate intervals.   I-Stat Chem 8, ED     Status: Abnormal   Collection Time: 01/28/17  5:49 PM  Result Value Ref Range   Sodium 139 135 - 145 mmol/L   Potassium 4.4 3.5 - 5.1 mmol/L   Chloride 101 101 - 111 mmol/L   BUN 26 (H) 6 - 20 mg/dL   Creatinine, Ser 1.10 0.61 - 1.24 mg/dL   Glucose, Bld 117 (H) 65 - 99 mg/dL   Calcium, Ion 1.13 (L) 1.15 - 1.40 mmol/L   TCO2 27 0 - 100 mmol/L   Hemoglobin 12.2 (L) 13.0 - 17.0 g/dL   HCT 36.0 (L) 39.0 - 52.0 %  CBG monitoring, ED     Status: None   Collection Time: 01/28/17  7:07 PM  Result Value Ref Range   Glucose-Capillary 86 65 - 99 mg/dL  Basic metabolic panel     Status: Abnormal   Collection Time: 01/29/17  4:42 AM  Result Value Ref Range   Sodium 136 135 - 145 mmol/L   Potassium 3.8 3.5 - 5.1 mmol/L   Chloride 104 101 - 111 mmol/L   CO2 26 22 - 32 mmol/L   Glucose, Bld 104 (H) 65 - 99 mg/dL   BUN 17 6 - 20 mg/dL   Creatinine, Ser 0.96 0.61 - 1.24 mg/dL   Calcium 8.3 (L) 8.9 - 10.3 mg/dL   GFR calc non Af Amer >60 >60 mL/min   GFR calc Af Amer >60 >60 mL/min    Comment: (NOTE) The eGFR has been calculated using the CKD EPI equation. This calculation has not been validated in all clinical situations. eGFR's persistently <60 mL/min signify possible Chronic Kidney Disease.    Anion gap 6 5 - 15  PSA     Status: None   Collection Time: 01/29/17  7:49 AM  Result Value Ref Range   PSA 0.75 0.00 - 4.00 ng/mL    Comment: (NOTE) While PSA levels of <=4.0 ng/ml are reported as reference range, some men with levels  below 4.0 ng/ml can have prostate cancer and many men with PSA above 4.0 ng/ml do not have prostate cancer.  Other tests such as free PSA, age specific reference ranges, PSA velocity and PSA doubling time may be helpful especially  in men less than 76 years old.     Ct Chest W Contrast  Result Date: 01/29/2017 CLINICAL DATA:  Brain mass. EXAM: CT CHEST, ABDOMEN, AND PELVIS WITH CONTRAST TECHNIQUE: Multidetector CT imaging of the chest, abdomen and pelvis was performed following the standard protocol during bolus administration of intravenous contrast. CONTRAST:  153m ISOVUE-300 IOPAMIDOL (ISOVUE-300) INJECTION 61% COMPARISON:  None. FINDINGS: CT CHEST FINDINGS Cardiovascular: Normal heart size. Aortic atherosclerosis. Calcification within the LAD coronary artery noted. Mediastinum/Nodes: The trachea appears patent and is midline. Normal appearance of the esophagus. No mediastinal or hilar adenopathy. No axillary adenopathy. Lungs/Pleura: Dependent changes and subsegmental atelectasis are noted within bilateral posterior lung bases. Small nodule in the right apex measures 4 mm, image 13 of series 205. 3 mm right upper lobe lung nodule is identified, image 47 of series 205. Musculoskeletal: Scoliosis and degenerative disc disease noted. Chronic left posterior rib fracture deformities are noted. No aggressive lytic or sclerotic bone lesions. CT ABDOMEN PELVIS FINDINGS Hepatobiliary: 2 cm cyst within right lobe of liver noted, image 48 of series 301. Vicarious excretion of contrast into the gallbladder noted. No biliary dilatation. Pancreas: Unremarkable. No pancreatic ductal dilatation or surrounding inflammatory changes. Spleen: Normal in size without focal abnormality. Adrenals/Urinary Tract: Normal adrenal glands. Nonobstructing calculus within the inferior pole the right kidney measures 4 mm, image 71 of series 301. The left kidney appears normal. Bladder appears normal. Stomach/Bowel: The stomach appears normal. The small bowel loops have a normal course and caliber. Distal colonic diverticula noted without acute inflammation. Vascular/Lymphatic: Aortic atherosclerosis. No enlarged abdominal or pelvic lymph nodes. Reproductive:  Fiducial markers noted within the prostate gland. Other: No abdominal wall hernia or abnormality. No abdominopelvic ascites. Musculoskeletal: Nonaggressive appearing sclerotic lesion within the right iliac bone is identified, image number 91 of series 301. No aggressive lytic or sclerotic bone lesions identified. Degenerative disc disease is identified at L4-5 and L5-S1. IMPRESSION: 1. No acute findings identified within the chest, abdomen or pelvis. No mass or adenopathy identified. 2. Small pulmonary nodules are identified within the right lung. No follow-up needed if Calvin Berry is low-risk (and has no known or suspected primary neoplasm). Non-contrast chest CT can be considered in 12 months if Calvin Berry is high-risk. This recommendation follows the consensus statement: Guidelines for Management of Incidental Pulmonary Nodules Detected on CT Images: From the Fleischner Society 2017; Radiology 2017; 284:228-243. 3. Aortic atherosclerosis and coronary artery calcification 4. Right renal calculus. Electronically Signed   By: TKerby MoorsM.D.   On: 01/29/2017 08:49   Mr BJeri CosAnd Wo Contrast  Result Date: 01/28/2017 CLINICAL DATA:  83y/o  M; left parietal brain lesion. EXAM: MRI HEAD WITHOUT AND WITH CONTRAST TECHNIQUE: Multiplanar, multiecho pulse sequences of the brain and surrounding structures were obtained without and with intravenous contrast. CONTRAST:  115mMULTIHANCE GADOBENATE DIMEGLUMINE 529 MG/ML IV SOLN COMPARISON:  01/28/2017 and 03/26/2011 CT of the head. FINDINGS: Brain: No diffusion signal abnormality to suggest acute or early subacute infarct. Background of few foci of T2 FLAIR hyperintense signal abnormality in white matter compatible with mild chronic microvascular ischemic changes and mild brain parenchymal volume loss. No hydrocephalus. No extra-axial collection. Mass lesion in the left parietal lobe subcortical white matter measuring approximately 18  x 21 x 16 mm (AP x ML x CC series 5,  image 19 and series 12, image 36). The mass is mildly T2 hyperintense and predominantly T1 hypointense. There are foci of T1 hyperintensity within the lesion with faint susceptibility blooming corresponding to calcifications on the prior CT of the head. After administration of intravenous contrast there is no appreciable enhancement. Within white matter surrounding the mass in the left parietal lobe there is T2 FLAIR hyperintense signal abnormality. The overlying cortical ribbon is intact. Vascular: Normal flow voids. Skull and upper cervical spine: Normal marrow signal. Sinuses/Orbits: No acute finding. Bilateral intra-ocular lens replacement. Other: None. IMPRESSION: Mass lesion in the left parietal subcortical white matter with mineralization, gyral expansion, and small surrounding region of edema. No appreciable enhancement. No diffusion restriction. Intact overlying cortical ribbon. Findings are atypical for infarction or hemorrhage and probably represent neoplasm such as a hypoenhancing metastasis or glial/oligoglial neoplasm. Electronically Signed   By: Kristine Garbe M.D.   On: 01/28/2017 21:34   Ct Abdomen Pelvis W Contrast  Result Date: 01/29/2017 CLINICAL DATA:  Brain mass. EXAM: CT CHEST, ABDOMEN, AND PELVIS WITH CONTRAST TECHNIQUE: Multidetector CT imaging of the chest, abdomen and pelvis was performed following the standard protocol during bolus administration of intravenous contrast. CONTRAST:  173m ISOVUE-300 IOPAMIDOL (ISOVUE-300) INJECTION 61% COMPARISON:  None. FINDINGS: CT CHEST FINDINGS Cardiovascular: Normal heart size. Aortic atherosclerosis. Calcification within the LAD coronary artery noted. Mediastinum/Nodes: The trachea appears patent and is midline. Normal appearance of the esophagus. No mediastinal or hilar adenopathy. No axillary adenopathy. Lungs/Pleura: Dependent changes and subsegmental atelectasis are noted within bilateral posterior lung bases. Small nodule in the  right apex measures 4 mm, image 13 of series 205. 3 mm right upper lobe lung nodule is identified, image 47 of series 205. Musculoskeletal: Scoliosis and degenerative disc disease noted. Chronic left posterior rib fracture deformities are noted. No aggressive lytic or sclerotic bone lesions. CT ABDOMEN PELVIS FINDINGS Hepatobiliary: 2 cm cyst within right lobe of liver noted, image 48 of series 301. Vicarious excretion of contrast into the gallbladder noted. No biliary dilatation. Pancreas: Unremarkable. No pancreatic ductal dilatation or surrounding inflammatory changes. Spleen: Normal in size without focal abnormality. Adrenals/Urinary Tract: Normal adrenal glands. Nonobstructing calculus within the inferior pole the right kidney measures 4 mm, image 71 of series 301. The left kidney appears normal. Bladder appears normal. Stomach/Bowel: The stomach appears normal. The small bowel loops have a normal course and caliber. Distal colonic diverticula noted without acute inflammation. Vascular/Lymphatic: Aortic atherosclerosis. No enlarged abdominal or pelvic lymph nodes. Reproductive: Fiducial markers noted within the prostate gland. Other: No abdominal wall hernia or abnormality. No abdominopelvic ascites. Musculoskeletal: Nonaggressive appearing sclerotic lesion within the right iliac bone is identified, image number 91 of series 301. No aggressive lytic or sclerotic bone lesions identified. Degenerative disc disease is identified at L4-5 and L5-S1. IMPRESSION: 1. No acute findings identified within the chest, abdomen or pelvis. No mass or adenopathy identified. 2. Small pulmonary nodules are identified within the right lung. No follow-up needed if Calvin Berry is low-risk (and has no known or suspected primary neoplasm). Non-contrast chest CT can be considered in 12 months if Calvin Berry is high-risk. This recommendation follows the consensus statement: Guidelines for Management of Incidental Pulmonary Nodules Detected on  CT Images: From the Fleischner Society 2017; Radiology 2017; 284:228-243. 3. Aortic atherosclerosis and coronary artery calcification 4. Right renal calculus. Electronically Signed   By: TKerby MoorsM.D.   On: 01/29/2017 08:49  Ct Head Code Stroke W/o Cm  Result Date: 01/28/2017 CLINICAL DATA:  Code stroke. Acute presentation with speech disturbance and visual disturbance. EXAM: CT HEAD WITHOUT CONTRAST TECHNIQUE: Contiguous axial images were obtained from the base of the skull through the vertex without intravenous contrast. COMPARISON:  Head CT 03/26/2011 FINDINGS: Brain: Brainstem and cerebellum are unremarkable. Right cerebral hemisphere appears normal for age with mild age related volume loss. On the left, there is a region of low density in the parietal region affecting the cortical and subcortical brain with areas of linear an indistinct hyperdensity, some of which is in the range highly suggestive of calcification rather than hemorrhage. Therefore, I am concerned about the possibility of a tumor in this location. The differential diagnosis would be recent infarction with mild hemorrhage, but that is not favored based on this CT. MRI would be suggested. Vascular: There is atherosclerotic calcification of the major vessels at the base of the brain. No focal hyperdense vessels. Skull: Negative Sinuses/Orbits: Clear/normal Other: None significant ASPECTS ([place] Stroke Program Early CT Score) Aspects not actually applicable in this case with suspected tumor. IMPRESSION: 1. Low-density in the left parietal region with linear and indistinct hyperdensity. This is favored to represent a mass lesion with calcification. The differential diagnosis does include recent infarction with hemorrhage, but that is not favored. MRI suggested. 2. These results were discussed by telephone at the time of interpretation on 01/28/2017 at 6:00 pm to Dr. Leonel Ramsay, who verbally acknowledged these results. Electronically  Signed   By: Nelson Chimes M.D.   On: 01/28/2017 18:07    ROS Blood pressure 139/84, pulse 86, temperature 98 F (36.7 C), temperature source Oral, resp. rate 20, height 5' 7"  (1.702 m), weight 66.1 kg (145 lb 11.2 oz), SpO2 98 %. Physical Exam  Constitutional: He appears well-developed and well-nourished.  HENT:  Head: Normocephalic and atraumatic.  Eyes: Conjunctivae and EOM are normal. Pupils are equal, round, and reactive to light.  Cardiovascular: Normal rate and regular rhythm.   Respiratory: Effort normal and breath sounds normal.  Neurological:  Calvin Berry is minimally verbal. He repeats the same phrase again and again he will not answer questions though he is pleasant. He will follow some movement commands only about 1 and 4. He cannot give any history. He appears to be moving all 4 extremities well. His level of consciousness is good.  Skin: Skin is warm and dry.    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  I will stop by tomorrow to discuss the situation with the family and examined her desires regarding how aggressive care we should continue as I'm concerned that even with biopsy and treatment of this lesion is speech deficit will not likely improve  Christalyn Goertz J 01/29/2017, 8:42 PM

## 2017-01-29 NOTE — Progress Notes (Signed)
EEG completed; results pending.    

## 2017-01-29 NOTE — Evaluation (Addendum)
Clinical/Bedside Swallow Evaluation Patient Details  Name: Calvin Berry MRN: [id number] Date of Birth: 04/01/1930  Today's Date: 01/29/2017 Time: SLP Start Time (ACUTE ONLY): 95 SLP Stop Time (ACUTE ONLY): 1018 SLP Time Calculation (min) (ACUTE ONLY): 13 min  Past Medical History:  Past Medical History:  Diagnosis Date  . Hypertension   . Hypothyroidism    Past Surgical History:  Past Surgical History:  Procedure Laterality Date  . TRANSURETHRAL RESECTION OF PROSTATE     HPI:  83 y.o.malewith a past medical history significant for HTN not on medication, and hypothyroidismwho presents with aphasia. MRI showed mass lesion in the left parietal subcortical white matter with mineralization, gyral expansion, and small surrounding region of edema, concerning for neoplasm.   Assessment / Plan / Recommendation Clinical Impression  Pt tolerated initial sips of thin liquid well, even with larger sequential boluses. Solids left mild-moderate oral residue without pt awareness, requiring multiple liquid washes to clear. After solids were tested, pt started to have frequent eructation with additional sips of thin liquid, followed by delayed coughing. Question possible esophageal component, however this did seem to be reduced with SLP intervention for removal of straw and management of bolus size. Recommend Dys 3 diet and thin liquids by cup with use of aspiration and esophageal precautions.  Of note, pt admitted with aphasia with both receptive and expressive language deficits noted during swallow evaluation. Recommend MD consider SLP cognitive-linguistic evaluation.    Aspiration Risk  Mild aspiration risk    Diet Recommendation Dysphagia 3 (Mech soft);Thin liquid   Liquid Administration via: Cup;No straw Medication Administration: Whole meds with puree Supervision: Patient able to self feed;Full supervision/cueing for compensatory strategies Compensations: Minimize environmental  distractions;Slow rate;Small sips/bites;Follow solids with liquid Postural Changes: Seated upright at 90 degrees;Remain upright for at least 30 minutes after po intake    Other  Recommendations Oral Care Recommendations: Oral care BID   Follow up Recommendations  (tba)      Frequency and Duration min 2x/week  2 weeks       Prognosis Prognosis for Safe Diet Advancement: Good Barriers to Reach Goals: Language deficits      Swallow Study   General HPI: 83 y.o.malewith a past medical history significant for HTN not on medication, and hypothyroidismwho presents with aphasia. MRI showed mass lesion in the left parietal subcortical white matter with mineralization, gyral expansion, and small surrounding region of edema, concerning for neoplasm. Type of Study: Bedside Swallow Evaluation Previous Swallow Assessment: none in chart Diet Prior to this Study: NPO Temperature Spikes Noted: No Respiratory Status: Room air History of Recent Intubation: No Behavior/Cognition: Alert;Cooperative;Requires cueing;Other (Comment) (aphasic) Oral Cavity Assessment: Within Functional Limits Oral Care Completed by SLP: No Oral Cavity - Dentition: Adequate natural dentition Patient Positioning: Upright in bed Baseline Vocal Quality: Normal    Oral/Motor/Sensory Function Overall Oral Motor/Sensory Function:  (difficulty following commands to formally assess)   Ice Chips Ice chips: Not tested   Thin Liquid Thin Liquid: Impaired Presentation: Cup;Straw Pharyngeal  Phase Impairments: Cough - Delayed    Nectar Thick Nectar Thick Liquid: Not tested   Honey Thick Honey Thick Liquid: Not tested   Puree Puree: Within functional limits Presentation: Spoon   Solid   GO   Solid: Impaired Oral Phase Impairments: Poor awareness of bolus;Impaired mastication Oral Phase Functional Implications: Oral residue    Functional Assessment Tool Used: skilled clinical judgment Functional Limitations:  Swallowing Swallow Current Status [id number]): At least 1 percent but less than 20 percent  impaired, limited or restricted Swallow Goal Status [phone]): At least 1 percent but less than 20 percent impaired, limited or restricted   Calvin Berry 01/29/2017,10:42 AM  Calvin Berry, M.A. CCC-SLP [phone]

## 2017-01-29 NOTE — Progress Notes (Signed)
PROGRESS NOTE    Calvin Berry  [id number] DOB: [birth year] DOA: 01/28/2017 PCP: Gilford Rile, MD    Brief Narrative:  83 y.o. male with a past medical history significant for HTN not on medication, and hypothyroidism who presents with aphasia.  Caveat that patient is unable to provide history, so all history collected from chart.  The patient was in his usual state of health today and LSN around noon by his wife, and then around 5PM was encountered by a friend who noticed he was "not talking" and "having difficulty speaking", and so he was brought in as a [place].   In the ED, pt was found to have CT findings of an indistinct L parietal lesion favored to be a brain mass. Neurology was consulted and pt was started on keppra. Hospitalist consulted for admission.  Assessment & Plan:   Principal Problem:   Aphasia Active Problems:   Brain mass   Anxiety disorder   Essential hypertension   Hypothyroidism, acquired   IDA (iron deficiency anemia)  1. Brain Mass:  - Suspect primary malignancy. - MRI brain reviewed. Findings of 1.8cm x 2.1cm x 1.6cm L parietal mass with surrounding edema - Reviewed CT chest and abd/pelvis.     -CT chest, abdomen and pelvis with contrast reviewed. Findings only of small pulm nodules in R lung with recs for no f/u if pt low risk and repeat imaging in 66mos if high risk -PSA 0.75 -Consulted Neurosurgery who will see in consultation -discussed neurosurgery who recommends continued keppra given concerns of possible seizure at time of presentation -have ordered decadron given edema on MRI  2. Hypertension:  - Noted to be hypertnsive at admission - BP stable at present  3. Hypothyroidism:  -Will continue levothyroxine - Stable present  DVT prophylaxis: Lovenox subQ Code Status: Full Family Communication: Pt in room, family at bedside Disposition Plan: Uncertain at this time  Consultants:   Neurology  Neursurgery  Procedures:      Antimicrobials: Anti-infectives    None       Subjective: Without complaints  Objective: Vitals:   01/29/17 0524 01/29/17 1026 01/29/17 1411 01/29/17 1732  BP: 136/84 132/77 125/73 139/84  Pulse: 95 88 80 86  Resp: 20 20 20 20   Temp: 98.4 F (36.9 C) 97 F (36.1 C) 98.7 F (37.1 C) 98 F (36.7 C)  TempSrc: Axillary Axillary Oral Oral  SpO2: 95% 98% 93% 98%  Weight:      Height:        Intake/Output Summary (Last 24 hours) at 01/29/17 1735 Last data filed at 01/29/17 1700  Gross per 24 hour  Intake              590 ml  Output             1100 ml  Net             -510 ml   Filed Weights   01/28/17 1809 01/28/17 2200  Weight: 65.7 kg (144 lb 13.5 oz) 66.1 kg (145 lb 11.2 oz)    Examination:  General exam: Appears calm and comfortable  Respiratory system: Clear to auscultation. Respiratory effort normal. Cardiovascular system: S1 & S2 heard, RRR. No JVD, murmurs, rubs, gallops or clicks. No pedal edema. Gastrointestinal system: Abdomen is nondistended, soft and nontender. No organomegaly or masses felt. Normal bowel sounds heard. Central nervous system: No seizures, no tremors Extremities: Symmetric 5 x 5 power. Skin: No rashes, lesions  Psychiatry: Judgement and insight appear  normal. Mood & affect appropriate.   Data Reviewed: I have personally reviewed following labs and imaging studies  CBC:  Recent Labs Lab 01/28/17 1744 01/28/17 1749  WBC 8.5  --   NEUTROABS 7.4  --   HGB 12.5* 12.2*  HCT 36.7* 36.0*  MCV 84.8  --   PLT 186  --    Basic Metabolic Panel:  Recent Labs Lab 01/28/17 1744 01/28/17 1749 01/29/17 0442  NA 136 139 136  K 4.4 4.4 3.8  CL 103 101 104  CO2 26  --  26  GLUCOSE 117* 117* 104*  BUN 21* 26* 17  CREATININE 1.06 1.10 0.96  CALCIUM 9.0  --  8.3*   GFR: Estimated Creatinine Clearance: 53.6 mL/min (by C-G formula based on SCr of 0.96 mg/dL). Liver Function Tests:  Recent Labs Lab 01/28/17 1744  AST 29   ALT 16*  ALKPHOS 47  BILITOT 1.0  PROT 5.8*  ALBUMIN 3.5   No results for input(s): LIPASE, AMYLASE in the last 168 hours. No results for input(s): AMMONIA in the last 168 hours. Coagulation Profile:  Recent Labs Lab 01/28/17 1744  INR 1.07   Cardiac Enzymes: No results for input(s): CKTOTAL, CKMB, CKMBINDEX, TROPONINI in the last 168 hours. BNP (last 3 results) No results for input(s): PROBNP in the last 8760 hours. HbA1C: No results for input(s): HGBA1C in the last 72 hours. CBG:  Recent Labs Lab 01/28/17 1907  GLUCAP 86   Lipid Profile: No results for input(s): CHOL, HDL, LDLCALC, TRIG, CHOLHDL, LDLDIRECT in the last 72 hours. Thyroid Function Tests: No results for input(s): TSH, T4TOTAL, FREET4, T3FREE, THYROIDAB in the last 72 hours. Anemia Panel: No results for input(s): VITAMINB12, FOLATE, FERRITIN, TIBC, IRON, RETICCTPCT in the last 72 hours. Sepsis Labs: No results for input(s): PROCALCITON, LATICACIDVEN in the last 168 hours.  No results found for this or any previous visit (from the past 240 hour(s)).   Radiology Studies: Ct Chest W Contrast  Result Date: 01/29/2017 CLINICAL DATA:  Brain mass. EXAM: CT CHEST, ABDOMEN, AND PELVIS WITH CONTRAST TECHNIQUE: Multidetector CT imaging of the chest, abdomen and pelvis was performed following the standard protocol during bolus administration of intravenous contrast. CONTRAST:  166mL ISOVUE-300 IOPAMIDOL (ISOVUE-300) INJECTION 61% COMPARISON:  None. FINDINGS: CT CHEST FINDINGS Cardiovascular: Normal heart size. Aortic atherosclerosis. Calcification within the LAD coronary artery noted. Mediastinum/Nodes: The trachea appears patent and is midline. Normal appearance of the esophagus. No mediastinal or hilar adenopathy. No axillary adenopathy. Lungs/Pleura: Dependent changes and subsegmental atelectasis are noted within bilateral posterior lung bases. Small nodule in the right apex measures 4 mm, image 13 of series 205. 3 mm  right upper lobe lung nodule is identified, image 47 of series 205. Musculoskeletal: Scoliosis and degenerative disc disease noted. Chronic left posterior rib fracture deformities are noted. No aggressive lytic or sclerotic bone lesions. CT ABDOMEN PELVIS FINDINGS Hepatobiliary: 2 cm cyst within right lobe of liver noted, image 48 of series 301. Vicarious excretion of contrast into the gallbladder noted. No biliary dilatation. Pancreas: Unremarkable. No pancreatic ductal dilatation or surrounding inflammatory changes. Spleen: Normal in size without focal abnormality. Adrenals/Urinary Tract: Normal adrenal glands. Nonobstructing calculus within the inferior pole the right kidney measures 4 mm, image 71 of series 301. The left kidney appears normal. Bladder appears normal. Stomach/Bowel: The stomach appears normal. The small bowel loops have a normal course and caliber. Distal colonic diverticula noted without acute inflammation. Vascular/Lymphatic: Aortic atherosclerosis. No enlarged abdominal or pelvic lymph nodes.  Reproductive: Fiducial markers noted within the prostate gland. Other: No abdominal wall hernia or abnormality. No abdominopelvic ascites. Musculoskeletal: Nonaggressive appearing sclerotic lesion within the right iliac bone is identified, image number 91 of series 301. No aggressive lytic or sclerotic bone lesions identified. Degenerative disc disease is identified at L4-5 and L5-S1. IMPRESSION: 1. No acute findings identified within the chest, abdomen or pelvis. No mass or adenopathy identified. 2. Small pulmonary nodules are identified within the right lung. No follow-up needed if patient is low-risk (and has no known or suspected primary neoplasm). Non-contrast chest CT can be considered in 12 months if patient is high-risk. This recommendation follows the consensus statement: Guidelines for Management of Incidental Pulmonary Nodules Detected on CT Images: From the Fleischner Society 2017; Radiology  2017; 284:228-243. 3. Aortic atherosclerosis and coronary artery calcification 4. Right renal calculus. Electronically Signed   By: Kerby Moors M.D.   On: 01/29/2017 08:49   Mr Jeri Cos And Wo Contrast  Result Date: 01/28/2017 CLINICAL DATA:  83 y/o  M; left parietal brain lesion. EXAM: MRI HEAD WITHOUT AND WITH CONTRAST TECHNIQUE: Multiplanar, multiecho pulse sequences of the brain and surrounding structures were obtained without and with intravenous contrast. CONTRAST:  72mL MULTIHANCE GADOBENATE DIMEGLUMINE 529 MG/ML IV SOLN COMPARISON:  01/28/2017 and 03/26/2011 CT of the head. FINDINGS: Brain: No diffusion signal abnormality to suggest acute or early subacute infarct. Background of few foci of T2 FLAIR hyperintense signal abnormality in white matter compatible with mild chronic microvascular ischemic changes and mild brain parenchymal volume loss. No hydrocephalus. No extra-axial collection. Mass lesion in the left parietal lobe subcortical white matter measuring approximately 18 x 21 x 16 mm (AP x ML x CC series 5, image 19 and series 12, image 36). The mass is mildly T2 hyperintense and predominantly T1 hypointense. There are foci of T1 hyperintensity within the lesion with faint susceptibility blooming corresponding to calcifications on the prior CT of the head. After administration of intravenous contrast there is no appreciable enhancement. Within white matter surrounding the mass in the left parietal lobe there is T2 FLAIR hyperintense signal abnormality. The overlying cortical ribbon is intact. Vascular: Normal flow voids. Skull and upper cervical spine: Normal marrow signal. Sinuses/Orbits: No acute finding. Bilateral intra-ocular lens replacement. Other: None. IMPRESSION: Mass lesion in the left parietal subcortical white matter with mineralization, gyral expansion, and small surrounding region of edema. No appreciable enhancement. No diffusion restriction. Intact overlying cortical ribbon.  Findings are atypical for infarction or hemorrhage and probably represent neoplasm such as a hypoenhancing metastasis or glial/oligoglial neoplasm. Electronically Signed   By: Kristine Garbe M.D.   On: 01/28/2017 21:34   Ct Abdomen Pelvis W Contrast  Result Date: 01/29/2017 CLINICAL DATA:  Brain mass. EXAM: CT CHEST, ABDOMEN, AND PELVIS WITH CONTRAST TECHNIQUE: Multidetector CT imaging of the chest, abdomen and pelvis was performed following the standard protocol during bolus administration of intravenous contrast. CONTRAST:  128mL ISOVUE-300 IOPAMIDOL (ISOVUE-300) INJECTION 61% COMPARISON:  None. FINDINGS: CT CHEST FINDINGS Cardiovascular: Normal heart size. Aortic atherosclerosis. Calcification within the LAD coronary artery noted. Mediastinum/Nodes: The trachea appears patent and is midline. Normal appearance of the esophagus. No mediastinal or hilar adenopathy. No axillary adenopathy. Lungs/Pleura: Dependent changes and subsegmental atelectasis are noted within bilateral posterior lung bases. Small nodule in the right apex measures 4 mm, image 13 of series 205. 3 mm right upper lobe lung nodule is identified, image 47 of series 205. Musculoskeletal: Scoliosis and degenerative disc disease noted. Chronic left  posterior rib fracture deformities are noted. No aggressive lytic or sclerotic bone lesions. CT ABDOMEN PELVIS FINDINGS Hepatobiliary: 2 cm cyst within right lobe of liver noted, image 48 of series 301. Vicarious excretion of contrast into the gallbladder noted. No biliary dilatation. Pancreas: Unremarkable. No pancreatic ductal dilatation or surrounding inflammatory changes. Spleen: Normal in size without focal abnormality. Adrenals/Urinary Tract: Normal adrenal glands. Nonobstructing calculus within the inferior pole the right kidney measures 4 mm, image 71 of series 301. The left kidney appears normal. Bladder appears normal. Stomach/Bowel: The stomach appears normal. The small bowel loops  have a normal course and caliber. Distal colonic diverticula noted without acute inflammation. Vascular/Lymphatic: Aortic atherosclerosis. No enlarged abdominal or pelvic lymph nodes. Reproductive: Fiducial markers noted within the prostate gland. Other: No abdominal wall hernia or abnormality. No abdominopelvic ascites. Musculoskeletal: Nonaggressive appearing sclerotic lesion within the right iliac bone is identified, image number 91 of series 301. No aggressive lytic or sclerotic bone lesions identified. Degenerative disc disease is identified at L4-5 and L5-S1. IMPRESSION: 1. No acute findings identified within the chest, abdomen or pelvis. No mass or adenopathy identified. 2. Small pulmonary nodules are identified within the right lung. No follow-up needed if patient is low-risk (and has no known or suspected primary neoplasm). Non-contrast chest CT can be considered in 12 months if patient is high-risk. This recommendation follows the consensus statement: Guidelines for Management of Incidental Pulmonary Nodules Detected on CT Images: From the Fleischner Society 2017; Radiology 2017; 284:228-243. 3. Aortic atherosclerosis and coronary artery calcification 4. Right renal calculus. Electronically Signed   By: Kerby Moors M.D.   On: 01/29/2017 08:49   Ct Head Code Stroke W/o Cm  Result Date: 01/28/2017 CLINICAL DATA:  Code stroke. Acute presentation with speech disturbance and visual disturbance. EXAM: CT HEAD WITHOUT CONTRAST TECHNIQUE: Contiguous axial images were obtained from the base of the skull through the vertex without intravenous contrast. COMPARISON:  Head CT 03/26/2011 FINDINGS: Brain: Brainstem and cerebellum are unremarkable. Right cerebral hemisphere appears normal for age with mild age related volume loss. On the left, there is a region of low density in the parietal region affecting the cortical and subcortical brain with areas of linear an indistinct hyperdensity, some of which is in the  range highly suggestive of calcification rather than hemorrhage. Therefore, I am concerned about the possibility of a tumor in this location. The differential diagnosis would be recent infarction with mild hemorrhage, but that is not favored based on this CT. MRI would be suggested. Vascular: There is atherosclerotic calcification of the major vessels at the base of the brain. No focal hyperdense vessels. Skull: Negative Sinuses/Orbits: Clear/normal Other: None significant ASPECTS ([place] Stroke Program Early CT Score) Aspects not actually applicable in this case with suspected tumor. IMPRESSION: 1. Low-density in the left parietal region with linear and indistinct hyperdensity. This is favored to represent a mass lesion with calcification. The differential diagnosis does include recent infarction with hemorrhage, but that is not favored. MRI suggested. 2. These results were discussed by telephone at the time of interpretation on 01/28/2017 at 6:00 pm to Dr. Leonel Ramsay, who verbally acknowledged these results. Electronically Signed   By: Nelson Chimes M.D.   On: 01/28/2017 18:07    Scheduled Meds: . dexamethasone  4 mg Intravenous Q6H  . enoxaparin (LOVENOX) injection  40 mg Subcutaneous Q24H  . levETIRAcetam  750 mg Intravenous Q12H   Continuous Infusions: . sodium chloride 125 mL/hr at 01/29/17 0736     LOS:  0 days   CHIU, Orpah Melter, MD [facility] Pager [phone]  If 7PM-7AM, please contact night-coverage [email or web address] Password [email or web address] 01/29/2017, 5:35 PM

## 2017-01-29 NOTE — Care Management Note (Signed)
Case Management Note  Patient Details  Name: Calvin Berry MRN: [id number] Date of Birth: [birth year]  Subjective/Objective:                  Patient presented with aphasia. Lives at home with spouse. CM will follow for discharge needs pending patient's progress and physician orders.   Action/Plan:   Expected Discharge Date:                  Expected Discharge Plan:     In-House Referral:     Discharge planning Services     Post Acute Care Choice:    Choice offered to:     DME Arranged:    DME Agency:     HH Arranged:    HH Agency:     Status of Service:     If discussed at [employer] of Stay Meetings, dates discussed:    Additional Comments:  Rolm Baptise, RN 01/29/2017, 10:26 AM

## 2017-01-30 ENCOUNTER — Inpatient Hospital Stay (HOSPITAL_COMMUNITY)
Admit: 2017-01-30 | Discharge: 2017-01-30 | Disposition: A | Discharge status: Home / Self Care | Payer: PPO | Attending: Neurology | Admitting: Neurology

## 2017-01-30 ENCOUNTER — Other Ambulatory Visit: Payer: Self-pay | Admitting: Neurological Surgery

## 2017-01-30 DIAGNOSIS — D496 Neoplasm of unspecified behavior of brain: Secondary | ICD-10-CM

## 2017-01-30 NOTE — Progress Notes (Signed)
PROGRESS NOTE    [name]  [id number] DOB: 11/20/1930 DOA: 01/28/2017 PCP: Gilford Rile, MD    Brief Narrative:  83 y.o.malewith a past medical history significant for HTN not on medication, and hypothyroidismwho presents with aphasia.  Caveat that patient is unable to provide history, so all history collected from chart. The patient was in his usual state of health today and LSN around noon by his wife, and then around 5PM was encountered by a friend who noticed he was "not talking" and "having difficulty speaking", and so he was brought in as a [place].   In the ED, pt was found to have CT findings of an indistinct L parietal lesion favored to be a brain mass. Neurology was consulted and pt was started on keppra. Hospitalist consulted for admission.  Assessment & Plan:   Principal Problem:   Aphasia Active Problems:   Primary brain tumor ([place])   Anxiety disorder   Essential hypertension   Hypothyroidism, acquired   IDA (iron deficiency anemia)   1. Brain Mass: - Suspect primary malignancy. - MRI brain reviewed. Findings of 1.8cm x 2.1cm x 1.6cm L parietal mass with surrounding edema - Reviewed CT chest and abd/pelvis.   -CT chest, abdomen and pelvis with contrast reviewed. Findings only of small pulm nodules in R lung with recs for no f/u if pt low risk and repeat imaging in 33mos if high risk -PSA 0.75 -Consulted Neurosurgery who will discuss plan with findings -continue decadron and keppra - Neuro following  2. Hypertension: - Noted to be hypertnsive at admission - BP remains stable  3. Hypothyroidism: -Will continue levothyroxine - remains stable  DVT prophylaxis: Levoenox Code Status: Full Family Communication: Pt in room, family not at bedside Disposition Plan: Uncertain at this time  Consultants:   Neurology   Neurosurgery  Procedures:     Antimicrobials: Anti-infectives    None       Subjective: No  complaints  Objective: Vitals:   01/30/17 0139 01/30/17 0514 01/30/17 0933 01/30/17 1357  BP: (!) 142/78 (!) 154/97 135/77 (!) 153/90  Pulse: 98 93 (!) 107 (!) 107  Resp: 20 20 20 20   Temp: 97.4 F (36.3 C) 97.5 F (36.4 C) 97.9 F (36.6 C) 98.3 F (36.8 C)  TempSrc: Axillary Oral Oral Oral  SpO2: 98% 94%  98%  Weight:      Height:        Intake/Output Summary (Last 24 hours) at 01/30/17 1658 Last data filed at 01/30/17 1100  Gross per 24 hour  Intake              600 ml  Output              300 ml  Net              300 ml   Filed Weights   01/28/17 1809 01/28/17 2200  Weight: 65.7 kg (144 lb 13.5 oz) 66.1 kg (145 lb 11.2 oz)    Examination:  General exam: Sitting in chair, conversant, in nad  Respiratory system: normal chest rise, no audible wheezing Cardiovascular system: regular rate, s1-s2 Gastrointestinal system: soft, nondistended, pos BS Central nervous system: no seizures, no tremors. Extremities: no clubbing, no cyanosis Skin: no pallor, no rashes Psychiatry: mood normal// no visual hallucinations.   Data Reviewed: I have personally reviewed following labs and imaging studies  CBC:  Recent Labs Lab 01/28/17 1744 01/28/17 1749  WBC 8.5  --   NEUTROABS 7.4  --  HGB 12.5* 12.2*  HCT 36.7* 36.0*  MCV 84.8  --   PLT 186  --    Basic Metabolic Panel:  Recent Labs Lab 01/28/17 1744 01/28/17 1749 01/29/17 0442  NA 136 139 136  K 4.4 4.4 3.8  CL 103 101 104  CO2 26  --  26  GLUCOSE 117* 117* 104*  BUN 21* 26* 17  CREATININE 1.06 1.10 0.96  CALCIUM 9.0  --  8.3*   GFR: Estimated Creatinine Clearance: 53.6 mL/min (by C-G formula based on SCr of 0.96 mg/dL). Liver Function Tests:  Recent Labs Lab 01/28/17 1744  AST 29  ALT 16*  ALKPHOS 47  BILITOT 1.0  PROT 5.8*  ALBUMIN 3.5   No results for input(s): LIPASE, AMYLASE in the last 168 hours. No results for input(s): AMMONIA in the last 168 hours. Coagulation Profile:  Recent  Labs Lab 01/28/17 1744  INR 1.07   Cardiac Enzymes: No results for input(s): CKTOTAL, CKMB, CKMBINDEX, TROPONINI in the last 168 hours. BNP (last 3 results) No results for input(s): PROBNP in the last 8760 hours. HbA1C: No results for input(s): HGBA1C in the last 72 hours. CBG:  Recent Labs Lab 01/28/17 1907  GLUCAP 86   Lipid Profile: No results for input(s): CHOL, HDL, LDLCALC, TRIG, CHOLHDL, LDLDIRECT in the last 72 hours. Thyroid Function Tests: No results for input(s): TSH, T4TOTAL, FREET4, T3FREE, THYROIDAB in the last 72 hours. Anemia Panel: No results for input(s): VITAMINB12, FOLATE, FERRITIN, TIBC, IRON, RETICCTPCT in the last 72 hours. Sepsis Labs: No results for input(s): PROCALCITON, LATICACIDVEN in the last 168 hours.  No results found for this or any previous visit (from the past 240 hour(s)).   Radiology Studies: Ct Chest W Contrast  Result Date: 01/29/2017 CLINICAL DATA:  Brain mass. EXAM: CT CHEST, ABDOMEN, AND PELVIS WITH CONTRAST TECHNIQUE: Multidetector CT imaging of the chest, abdomen and pelvis was performed following the standard protocol during bolus administration of intravenous contrast. CONTRAST:  159mL ISOVUE-300 IOPAMIDOL (ISOVUE-300) INJECTION 61% COMPARISON:  None. FINDINGS: CT CHEST FINDINGS Cardiovascular: Normal heart size. Aortic atherosclerosis. Calcification within the LAD coronary artery noted. Mediastinum/Nodes: The trachea appears patent and is midline. Normal appearance of the esophagus. No mediastinal or hilar adenopathy. No axillary adenopathy. Lungs/Pleura: Dependent changes and subsegmental atelectasis are noted within bilateral posterior lung bases. Small nodule in the right apex measures 4 mm, image 13 of series 205. 3 mm right upper lobe lung nodule is identified, image 47 of series 205. Musculoskeletal: Scoliosis and degenerative disc disease noted. Chronic left posterior rib fracture deformities are noted. No aggressive lytic or  sclerotic bone lesions. CT ABDOMEN PELVIS FINDINGS Hepatobiliary: 2 cm cyst within right lobe of liver noted, image 48 of series 301. Vicarious excretion of contrast into the gallbladder noted. No biliary dilatation. Pancreas: Unremarkable. No pancreatic ductal dilatation or surrounding inflammatory changes. Spleen: Normal in size without focal abnormality. Adrenals/Urinary Tract: Normal adrenal glands. Nonobstructing calculus within the inferior pole the right kidney measures 4 mm, image 71 of series 301. The left kidney appears normal. Bladder appears normal. Stomach/Bowel: The stomach appears normal. The small bowel loops have a normal course and caliber. Distal colonic diverticula noted without acute inflammation. Vascular/Lymphatic: Aortic atherosclerosis. No enlarged abdominal or pelvic lymph nodes. Reproductive: Fiducial markers noted within the prostate gland. Other: No abdominal wall hernia or abnormality. No abdominopelvic ascites. Musculoskeletal: Nonaggressive appearing sclerotic lesion within the right iliac bone is identified, image number 91 of series 301. No aggressive lytic or sclerotic  bone lesions identified. Degenerative disc disease is identified at L4-5 and L5-S1. IMPRESSION: 1. No acute findings identified within the chest, abdomen or pelvis. No mass or adenopathy identified. 2. Small pulmonary nodules are identified within the right lung. No follow-up needed if patient is low-risk (and has no known or suspected primary neoplasm). Non-contrast chest CT can be considered in 12 months if patient is high-risk. This recommendation follows the consensus statement: Guidelines for Management of Incidental Pulmonary Nodules Detected on CT Images: From the Fleischner Society 2017; Radiology 2017; 284:228-243. 3. Aortic atherosclerosis and coronary artery calcification 4. Right renal calculus. Electronically Signed   By: Kerby Moors M.D.   On: 01/29/2017 08:49   Mr Calvin Berry And Wo Contrast  Result  Date: 01/28/2017 CLINICAL DATA:  83 y/o  M; left parietal brain lesion. EXAM: MRI HEAD WITHOUT AND WITH CONTRAST TECHNIQUE: Multiplanar, multiecho pulse sequences of the brain and surrounding structures were obtained without and with intravenous contrast. CONTRAST:  89mL MULTIHANCE GADOBENATE DIMEGLUMINE 529 MG/ML IV SOLN COMPARISON:  01/28/2017 and 03/26/2011 CT of the head. FINDINGS: Brain: No diffusion signal abnormality to suggest acute or early subacute infarct. Background of few foci of T2 FLAIR hyperintense signal abnormality in white matter compatible with mild chronic microvascular ischemic changes and mild brain parenchymal volume loss. No hydrocephalus. No extra-axial collection. Mass lesion in the left parietal lobe subcortical white matter measuring approximately 18 x 21 x 16 mm (AP x ML x CC series 5, image 19 and series 12, image 36). The mass is mildly T2 hyperintense and predominantly T1 hypointense. There are foci of T1 hyperintensity within the lesion with faint susceptibility blooming corresponding to calcifications on the prior CT of the head. After administration of intravenous contrast there is no appreciable enhancement. Within white matter surrounding the mass in the left parietal lobe there is T2 FLAIR hyperintense signal abnormality. The overlying cortical ribbon is intact. Vascular: Normal flow voids. Skull and upper cervical spine: Normal marrow signal. Sinuses/Orbits: No acute finding. Bilateral intra-ocular lens replacement. Other: None. IMPRESSION: Mass lesion in the left parietal subcortical white matter with mineralization, gyral expansion, and small surrounding region of edema. No appreciable enhancement. No diffusion restriction. Intact overlying cortical ribbon. Findings are atypical for infarction or hemorrhage and probably represent neoplasm such as a hypoenhancing metastasis or glial/oligoglial neoplasm. Electronically Signed   By: Kristine Garbe M.D.   On:  01/28/2017 21:34   Ct Abdomen Pelvis W Contrast  Result Date: 01/29/2017 CLINICAL DATA:  Brain mass. EXAM: CT CHEST, ABDOMEN, AND PELVIS WITH CONTRAST TECHNIQUE: Multidetector CT imaging of the chest, abdomen and pelvis was performed following the standard protocol during bolus administration of intravenous contrast. CONTRAST:  116mL ISOVUE-300 IOPAMIDOL (ISOVUE-300) INJECTION 61% COMPARISON:  None. FINDINGS: CT CHEST FINDINGS Cardiovascular: Normal heart size. Aortic atherosclerosis. Calcification within the LAD coronary artery noted. Mediastinum/Nodes: The trachea appears patent and is midline. Normal appearance of the esophagus. No mediastinal or hilar adenopathy. No axillary adenopathy. Lungs/Pleura: Dependent changes and subsegmental atelectasis are noted within bilateral posterior lung bases. Small nodule in the right apex measures 4 mm, image 13 of series 205. 3 mm right upper lobe lung nodule is identified, image 47 of series 205. Musculoskeletal: Scoliosis and degenerative disc disease noted. Chronic left posterior rib fracture deformities are noted. No aggressive lytic or sclerotic bone lesions. CT ABDOMEN PELVIS FINDINGS Hepatobiliary: 2 cm cyst within right lobe of liver noted, image 48 of series 301. Vicarious excretion of contrast into the gallbladder noted. No  biliary dilatation. Pancreas: Unremarkable. No pancreatic ductal dilatation or surrounding inflammatory changes. Spleen: Normal in size without focal abnormality. Adrenals/Urinary Tract: Normal adrenal glands. Nonobstructing calculus within the inferior pole the right kidney measures 4 mm, image 71 of series 301. The left kidney appears normal. Bladder appears normal. Stomach/Bowel: The stomach appears normal. The small bowel loops have a normal course and caliber. Distal colonic diverticula noted without acute inflammation. Vascular/Lymphatic: Aortic atherosclerosis. No enlarged abdominal or pelvic lymph nodes. Reproductive: Fiducial markers  noted within the prostate gland. Other: No abdominal wall hernia or abnormality. No abdominopelvic ascites. Musculoskeletal: Nonaggressive appearing sclerotic lesion within the right iliac bone is identified, image number 91 of series 301. No aggressive lytic or sclerotic bone lesions identified. Degenerative disc disease is identified at L4-5 and L5-S1. IMPRESSION: 1. No acute findings identified within the chest, abdomen or pelvis. No mass or adenopathy identified. 2. Small pulmonary nodules are identified within the right lung. No follow-up needed if patient is low-risk (and has no known or suspected primary neoplasm). Non-contrast chest CT can be considered in 12 months if patient is high-risk. This recommendation follows the consensus statement: Guidelines for Management of Incidental Pulmonary Nodules Detected on CT Images: From the Fleischner Society 2017; Radiology 2017; 284:228-243. 3. Aortic atherosclerosis and coronary artery calcification 4. Right renal calculus. Electronically Signed   By: Kerby Moors M.D.   On: 01/29/2017 08:49   Ct Head Code Stroke W/o Cm  Result Date: 01/28/2017 CLINICAL DATA:  Code stroke. Acute presentation with speech disturbance and visual disturbance. EXAM: CT HEAD WITHOUT CONTRAST TECHNIQUE: Contiguous axial images were obtained from the base of the skull through the vertex without intravenous contrast. COMPARISON:  Head CT 03/26/2011 FINDINGS: Brain: Brainstem and cerebellum are unremarkable. Right cerebral hemisphere appears normal for age with mild age related volume loss. On the left, there is a region of low density in the parietal region affecting the cortical and subcortical brain with areas of linear an indistinct hyperdensity, some of which is in the range highly suggestive of calcification rather than hemorrhage. Therefore, I am concerned about the possibility of a tumor in this location. The differential diagnosis would be recent infarction with mild  hemorrhage, but that is not favored based on this CT. MRI would be suggested. Vascular: There is atherosclerotic calcification of the major vessels at the base of the brain. No focal hyperdense vessels. Skull: Negative Sinuses/Orbits: Clear/normal Other: None significant ASPECTS ([place] Stroke Program Early CT Score) Aspects not actually applicable in this case with suspected tumor. IMPRESSION: 1. Low-density in the left parietal region with linear and indistinct hyperdensity. This is favored to represent a mass lesion with calcification. The differential diagnosis does include recent infarction with hemorrhage, but that is not favored. MRI suggested. 2. These results were discussed by telephone at the time of interpretation on 01/28/2017 at 6:00 pm to Dr. Leonel Ramsay, who verbally acknowledged these results. Electronically Signed   By: Nelson Chimes M.D.   On: 01/28/2017 18:07    Scheduled Meds: . dexamethasone  4 mg Intravenous Q6H  . diazepam  10 mg Oral Once  . enoxaparin (LOVENOX) injection  40 mg Subcutaneous Q24H  . levETIRAcetam  750 mg Intravenous Q12H   Continuous Infusions:   LOS: 1 day   CHIU, Orpah Melter, MD [facility] Pager [phone]  If 7PM-7AM, please contact night-coverage [email or web address] Password [facility] 01/30/2017, 4:58 PM

## 2017-01-30 NOTE — Progress Notes (Signed)
Speech Language Pathology Treatment: Dysphagia  Patient Details Name: Calvin Berry MRN: [id number] DOB: October 04, 1930 Today's Date: 01/30/2017 Time: [id number] SLP Time Calculation (min) (ACUTE ONLY): 31 min  Assessment / Plan / Recommendation Clinical Impression  Dysphagia treatment provided for diet check and trial of advanced solids. Pt did not show overt s/s of aspiration when provided trials of regular solid and thin liquids today. Pt again has minimal oral residuals following trial of solid which is cleared when following up with liquid, provided moderate verbal/ visual cues for following directions. Recommend continuing current diet of dysphagia 3/ thin liquids, meds whole with puree, full supervision- minimize distractions, alternate solid with liquid. Will continue to follow for diet tolerance/ consider advancement and for communication needs.   HPI HPI: 83 y.o.malewith a past medical history significant for HTN not on medication, and hypothyroidismwho presents with aphasia. MRI showed mass lesion in the left parietal subcortical white matter with mineralization, gyral expansion, and small surrounding region of edema, concerning for neoplasm.      SLP Plan  Continue with current plan of care     Recommendations  Diet recommendations: Dysphagia 3 (mechanical soft);Thin liquid Liquids provided via: Cup;Straw Medication Administration: Whole meds with puree Supervision: Patient able to self feed;Full supervision/cueing for compensatory strategies Compensations: Minimize environmental distractions;Slow rate;Small sips/bites;Follow solids with liquid Postural Changes and/or Swallow Maneuvers: Seated upright 90 degrees;Upright 30-60 min after meal                Oral Care Recommendations: Oral care BID Follow up Recommendations: 24 hour supervision/assistance Plan: Continue with current plan of care       Matthews, [place], [place], CCC-SLP 01/30/2017, 9:33  AM [phone]

## 2017-01-30 NOTE — Evaluation (Signed)
Physical Therapy Evaluation Patient Details Name: Calvin Berry MRN: [id number] DOB: 04/15/1930 Today's Date: 01/30/2017   History of Present Illness  Calvin Berry is a 83 y.o. male with a past medical history significant for HTN not on medication, and hypothyroidism who presents with aphasia. Patient had an MRI which was of poor quality secondary to motion degradation but it demonstrates a lesion measuring approximately 2 cm in the left parietal region. There is a substantial amount of edema around this lesion and this appears to be most consistent with either a metastatic lesion or primary brain tumor.  Clinical Impression  Pt admitted with above diagnosis. Pt currently with functional limitations due to the deficits listed below (see PT Problem List). Pt will benefit from skilled PT to increase their independence and safety with mobility to allow discharge to the venue listed below.  Pt able to follow 1 step instructions with increased time < 15% of the time with apahsia. Pt perseverating on his dentist entire session.  Easily distracted and unable to ambulate more than a few feet before starting to try and sit down with no chair behind him.  Recommend SNF as pt lives with son who works and wife who lives in another house would not be able to physically assist him.     Follow Up Recommendations SNF;Supervision/Assistance - 24 hour;Supervision for mobility/OOB    Equipment Recommendations  Other (comment) (to be determined)    Recommendations for Other Services       Precautions / Restrictions Precautions Precautions: Fall Restrictions Weight Bearing Restrictions: No      Mobility  Bed Mobility               General bed mobility comments: Pt up in recliner upon entry with wife and son present  Transfers Overall transfer level: Needs assistance Equipment used: 1 person hand held assist;2 person hand held assist Transfers: Sit to/from Stand Sit to Stand: Mod  assist;+2 physical assistance         General transfer comment: Attempted to stand with 1 person A, but distracted by IV pole and appeared weak, so RW brought over. RW was too distracting for pt.  Moved RW and son A on one side and PT on the other and got pt to stand with MOD A.   Ambulation/Gait Ambulation/Gait assistance: Mod assist;Max assist;+2 physical assistance Ambulation Distance (Feet): 3 Feet Assistive device: 2 person hand held assist Gait Pattern/deviations: Ataxic;Trunk flexed     General Gait Details: Ambulated about 3' with HHA and IV pole, but then began to flex knees and reaching back.  Pt ambulated backwards back to recliner with MAX A of PT and son. Pt unable to follow instructions with gait.  Stairs            Wheelchair Mobility    Modified Rankin (Stroke Patients Only) Modified Rankin (Stroke Patients Only) Pre-Morbid Rankin Score: No symptoms Modified Rankin: Severe disability     Balance Overall balance assessment: Needs assistance Sitting-balance support: Feet supported Sitting balance-Leahy Scale: Fair     Standing balance support: Bilateral upper extremity supported Standing balance-Leahy Scale: Poor                               Pertinent Vitals/Pain Pain Assessment: Faces Faces Pain Scale: No hurt    Home Living Family/patient expects to be discharged to:: Private residence Living Arrangements: Children Available Help at Discharge: Family Type of Home:  House Home Access: Level entry     Home Layout: One level;Laundry or work area in basement   Additional Comments: wife lives in separate house, but lives with son    Prior Function Level of Independence: Independent               Engineer, manufacturing Dominance   Dominant Hand: Right    Extremity/Trunk Assessment   Upper Extremity Assessment Upper Extremity Assessment: Defer to OT evaluation    Lower Extremity Assessment Lower Extremity Assessment: Difficult to  assess due to impaired cognition       Communication   Communication: Receptive difficulties;Expressive difficulties  Cognition Arousal/Alertness: Awake/alert   Overall Cognitive Status: Difficult to assess Area of Impairment: Safety/judgement;Following commands       Following Commands: Follows one step commands inconsistently Safety/Judgement: Decreased awareness of safety;Decreased awareness of deficits     General Comments: Pt talking about his dentist entire session    General Comments      Exercises     Assessment/Plan    PT Assessment Patient needs continued PT services  PT Problem List Decreased balance;Decreased cognition;Decreased knowledge of use of DME;Decreased mobility;Decreased coordination;Decreased safety awareness          PT Treatment Interventions DME instruction;Gait training;Functional mobility training;Therapeutic activities;Therapeutic exercise;Patient/family education;Balance training    PT Goals (Current goals can be found in the Care Plan section)  Acute Rehab PT Goals Patient Stated Goal: for pt to walk PT Goal Formulation: With family Time For Goal Achievement: 02/13/17 Potential to Achieve Goals: Fair    Frequency Min 3X/week   Barriers to discharge        Co-evaluation               End of Session Equipment Utilized During Treatment: Gait belt Activity Tolerance: Treatment limited secondary to medical complications (Comment) (distractability and aphasia) Patient left: in chair;with call bell/phone within reach;with chair alarm set;with family/visitor present Nurse Communication: Mobility status         Time: 1351-1420 PT Time Calculation (min) (ACUTE ONLY): 29 min   Charges:   PT Evaluation $PT Eval Moderate Complexity: 1 Procedure PT Treatments $Therapeutic Activity: 8-22 mins   PT G Codes:        Calvin Berry 01/30/2017, 2:40 PM

## 2017-01-30 NOTE — Progress Notes (Signed)
Subjective: Patient remains a phasic. He is awake and able to mimic actions and is perseverating on the date. When asking him how he is doing again he perseverates on trying to tell me the date.  Exam: Vitals:   01/30/17 0139 01/30/17 0514  BP: (!) 142/78 (!) 154/97  Pulse: 98 93  Resp: 20 20  Temp: 97.4 F (36.3 C) 97.5 F (36.4 C)    Gen: In bed, NAD Resp: non-labored breathing, no acute distress Abd: soft, nt  Neuro: MS: awake, alert, both receptive and expressive aphasia.  CN: Does not participate well with VF testing. Face symmetric Motor: he moves all extremities well.  Sensory:intact to LT     Pertinent Labs/Diagnostics: EEG shows awake and asleep with some occasional focal slowing over the left temporal parietal occipital region but no epileptiform activity  Etta Quill PA-C [facility] [phone]  Impression: 83 yo M with left parietal mass. I suspect he had a seizure  and on admission and possibly increased edema as a cause for his aphasia continuing. One other possibility would be intermittent ongoing seizures. I think a prolonged EEG would be helpful to assess for ongoing subclinical seizures as a cause for his aphasia not improving.   Recommendations: 1) Agree with dexamethasone 4mg  Q6H 2) Continue keppra 750mg  BID 3) nsgy consult. --Neurosurgery stop by yesterday however is going to have a family meeting to discuss how aggressive the care family would like in this situation.  Roland Rack, MD [facility] [phone]  If 7pm- 7am, please page neurology on call as listed in [place].  01/30/2017, 9:13 AM

## 2017-01-30 NOTE — Evaluation (Signed)
Speech Language Pathology Evaluation Patient Details Name: Calvin Berry MRN: [id number] DOB: 30-Sep-1930 Today's Date: 01/30/2017 Time: [id number] SLP Time Calculation (min) (ACUTE ONLY): 31 min  Problem List:  Patient Active Problem List   Diagnosis Date Noted  . Aphasia 01/28/2017  . Brain mass 01/28/2017  . Essential hypertension 02/25/2016  . Hypothyroidism, acquired 02/25/2016  . Anxiety disorder 09/13/2014  . IDA (iron deficiency anemia) 09/13/2014   Past Medical History:  Past Medical History:  Diagnosis Date  . Hypertension   . Hypothyroidism    Past Surgical History:  Past Surgical History:  Procedure Laterality Date  . TRANSURETHRAL RESECTION OF PROSTATE     HPI:  84 y.o.malewith a past medical history significant for HTN not on medication, and hypothyroidismwho presents with aphasia. MRI showed mass lesion in the left parietal subcortical white matter with mineralization, gyral expansion, and small surrounding region of edema, concerning for neoplasm.   Assessment / Plan / Recommendation Clinical Impression  Pt currently presenting with a moderate-severe receptive/ expressive aphasia. Speech is fluent and characterized by severe perseveration. Pt stated "which hospital am I in?", "my son works here", stated date of birth,  and then perseverated on date of birth in response throughout 89 of the evaluation despite efforts to redirect. Pt did open mouth following visual cue but did not follow any other 1-step commands despite visual/ verbal cues. At end of evaluation pt did look at buttons on side of bed and asked "what do these do?" Does not seem aware of deficits. Will continue to follow to address safety and communication needs.    SLP Assessment  Patient needs continued Speech Lanaguage Pathology Services    Follow Up Recommendations  Other (comment) (TBD)    Frequency and Duration min 2x/week  2 weeks      SLP Evaluation Cognition  Overall  Cognitive Status: Impaired/Different from baseline (difficult to fully assess d/t aphasia) Arousal/Alertness: Awake/alert Orientation Level: Other (comment) (perseverative- knew he was in hospital- asked "what hospital) Attention: Sustained;Selective Sustained Attention: Appears intact Selective Attention: Appears intact Memory:  (difficult to assess) Awareness: Impaired Behaviors: Perseveration Safety/Judgment: Impaired       Comprehension  Auditory Comprehension Overall Auditory Comprehension: Impaired Yes/No Questions: Impaired Basic Biographical Questions: 26-50% accurate Commands: Impaired One Step Basic Commands: 25-49% accurate Conversation: Simple EffectiveTechniques: Visual/Gestural cues    Expression Expression Primary Mode of Expression: Verbal Verbal Expression Overall Verbal Expression: Impaired Initiation: No impairment Level of Generative/Spontaneous Verbalization: Conversation Repetition: Impaired Level of Impairment: Word level Naming: Impairment Confrontation: Impaired Verbal Errors: Not aware of errors;Other (comment) (perseveration) Pragmatics: No impairment Effective Techniques: Other (Comment) (visual cues) Non-Verbal Means of Communication: Not applicable   Oral / Motor  Oral Motor/Sensory Function Overall Oral Motor/Sensory Function: Within functional limits Motor Speech Overall Motor Speech: Appears within functional limits for tasks assessed Respiration: Within functional limits Phonation: Normal Resonance: Within functional limits Articulation: Within functional limitis Intelligibility: Intelligible Motor Planning: Witnin functional limits Motor Speech Errors: Not applicable   GO                    Kern Reap, Page, CCC-SLP 01/30/2017, 9:55 AM [phone]

## 2017-01-30 NOTE — Progress Notes (Signed)
LTM hooked up and running. 

## 2017-01-31 ENCOUNTER — Inpatient Hospital Stay (HOSPITAL_COMMUNITY): Payer: PPO

## 2017-01-31 MED ORDER — LORAZEPAM 2 MG/ML IJ SOLN
0.5000 mg | INTRAMUSCULAR | Status: DC | PRN
  Administered 2017-01-31 – 2017-02-02 (×6): 1 mg via INTRAVENOUS
  Filled 2017-01-31 (×8): qty 1

## 2017-01-31 MED ORDER — LORAZEPAM 2 MG/ML IJ SOLN
1.0000 mg | Freq: Once | INTRAMUSCULAR | Status: AC
  Administered 2017-01-31: 1 mg via INTRAVENOUS

## 2017-01-31 NOTE — Progress Notes (Signed)
Patient ID: Calvin Berry, male   DOB: [birth year], 83 y.o.   MRN: [id number] Patient appears much more awake alert and talkative. Nonetheless he still has a notable Warneke's aphasia using some inappropriate and alternative words. I tried to explain to him the nature of what is occurring with a tumor inside his head and he feels that this is related to a lesion on his scalp where he had surgery years ago. His insight into his speech problem is very minimal. He does not appear to be aware of the difficulties he is having with communication.  I contacted the patient's son to discuss his situation. They are pleased to see that he is talking better but also are aware that his speech is not normal.  I discussed consideration of a biopsy. Biopsy would help differentiate whether this is what we suspect, a malignant tumor or what is much less likely, some form of a stroke. A biopsy in any event is likely to make his speech problem worse.  One treatment option that I have put forth to consider is simple observation and holding off on a biopsy. If this is a stroke then with further time his speech problem may improve spontaneously. Certainly the steroids appear to have had an impact. I'm also aware that there is an outside chance there could be some low-grade seizure activity that is occurring and there is been discussion of obtaining monitored EEG to see if that is the case. This can be done presently while we are simply observing him.  If things do not improve and it is desired to pursue aggressive therapy for a tumor in this region then a biopsy will be necessary, but until that course becomes clear we'll hold off on any consideration of surgical intervention . We can also hold off on obtaining the repeat MRI.  I'll continue to follow this patient with you.

## 2017-01-31 NOTE — Procedures (Signed)
ELECTROENCEPHALOGRAM REPORT  Date of Study: 01/30/2017 -01/31/2017  Patient's Name: Calvin Berry MRN: [id number] Date of Birth: 1930/06/11  Referring Provider: Dr. Leonel Ramsay  Clinical History: This is an 83 year old man with aphasia. EEG is being done to rule out subclinical seizures contributing to the symptoms.  Medications: Per EMR  Technical Summary: A multichannel digital continuous EEG recording using 16 (or more channel) electrodes, placed according to international 10-20 system.  Hyperventilation and photic stimulation were not performed.  The digital EEG was referentially recorded, reformatted, and digitally filtered in a variety of bipolar and referential montages for optimal display.    Description: The patient is predominantly drowsy and asleep during the recording.  During brief period of wakefulness, there is a asymmetric, medium voltage 10 Hz posterior dominant rhythm better formed over the right occipital region that attenuates with eye opening and eye closure. There is occasional focal 3-4 Hz slowing over the left temporoparietooccipital region. During drowsiness and sleep, there is an increase in theta slowing of the background with poorly formed vertex waves and sleep spindles were seen.  There were no epileptiform discharges or electrographic seizures seen.    EKG lead showed frequent extrasystolic beats.  Impression: This  EEG is abnormal due to:  Focal slowing over the left parieto-temporal region.  Clinical Correlation of the above findings indicates focal cerebral dysfunction over the  left parieto-temporall region suggestive of underlying structural or physiologic abnormality. No electrographic seizures were seen.

## 2017-01-31 NOTE — Progress Notes (Signed)
Subjective: Patient appears to be significantly improved.   Exam: Vitals:   01/31/17 0639 01/31/17 0911  BP: (!) 158/90 (!) 144/84  Pulse: 86 100  Resp:  20  Temp:  98.2 F (36.8 C)   Gen: In bed, NAD Resp: non-labored breathing, no acute distress Abd: soft, nt  Neuro: MS: awake, aphasic but much improved from previous days.  [id number], EOMI Motor: Moves extremities well.  Sensory:difficult to be sure, but appears to have intact sensation.    Impression: 83 yo M with left parietal mass. I suspect he had a seizure  and on admission and possibly increased edema as a cause for his aphasia continuing. To exclude ongoing subclinical seizures, an overnight EEG was performed which is negative.   I think that stroke with this appearance on MRI is very unlikely.   Recommendations: 1) Keppra 500mg  BID 2) would consider biopsy if family wants aggressive therapy.  3) continue decadron.  4) No further recommendations at this time, please call with further questions or concerns. Neurology to sign off.   Roland Rack, MD [facility] [phone]  If 7pm- 7am, please page neurology on call as listed in [place].

## 2017-01-31 NOTE — Procedures (Deleted)
ELECTROENCEPHALOGRAM REPORT  Date of Study: 01/29/2017  Patient's Name: Calvin Berry MRN: [id number] Date of Birth: Aug 23, 1930  Referring Provider: Dr. Leonel Ramsay  Clinical History: This is an 83 year old man with aphasia. EEG is being done to rule out subclinical seizures contributing to the symptoms.  Medications: Per EMR  Technical Summary: A multichannel digital continuous EEG recording using 16 (or more channel) electrodes, placed according to international 10-20 system.  Hyperventilation and photic stimulation were not performed.  The digital EEG was referentially recorded, reformatted, and digitally filtered in a variety of bipolar and referential montages for optimal display.    Description: The patient is predominantly drowsy and asleep during the recording.  During brief period of wakefulness, there is a asymmetric, medium voltage 10 Hz posterior dominant rhythm better formed over the right occipital region that attenuates with eye opening and eye closure. There is occasional focal 3-4 Hz slowing over the left temporoparietooccipital region. During drowsiness and sleep, there is an increase in theta slowing of the background with poorly formed vertex waves and sleep spindles were seen.  There were no epileptiform discharges or electrographic seizures seen.    EKG lead showed frequent extrasystolic beats.  Impression: This  EEG is abnormal due to:  Focal slowing over the left parieto-temporal region.  Clinical Correlation of the above findings indicates focal cerebral dysfunction over the  left parieto-temporall region suggestive of underlying structural or physiologic abnormality. No electrographic seizures were seen.

## 2017-01-31 NOTE — Progress Notes (Signed)
Pt. Went to MRI,RN was called by X-ray tech because patient was agitated and confused,unable to follow commands.RN went to X ray department  and gave 1 mg of ativan.IV.15 min after, RN was called again,the technician said that the pt. Was more agitated.MD. Was available on the floor,verbal order for 1 mg of IV ativan times once,was given.RN went to [facility] department and medicated the pt. And tried to calm him down.After 20 min,Xray tech called RN back saying that the pt. Still uncooperative and agitated and that they were unable to do the test.MD. Still available on the floor and was notified about the situation.Family at the bedside.Keep monitoring pt. Closely and assessing his needs.

## 2017-01-31 NOTE — Progress Notes (Signed)
Neurology paged. IV ativan 0.5-1 mg q 4 hrs PRN for anxiety, restlessness per Dr. Cheral Marker.

## 2017-01-31 NOTE — Progress Notes (Signed)
PROGRESS NOTE    Calvin Berry  [id number] DOB: 1930/05/22 DOA: 01/28/2017 PCP: Gilford Rile, MD    Brief Narrative:  83 y.o.malewith a past medical history significant for HTN not on medication, and hypothyroidismwho presents with aphasia.  Caveat that patient is unable to provide history, so all history collected from chart. The patient was in his usual state of health today and LSN around noon by his wife, and then around 5PM was encountered by a friend who noticed he was "not talking" and "having difficulty speaking", and so he was brought in as a [place].   In the ED, pt was found to have CT findings of an indistinct L parietal lesion favored to be a brain mass. Neurology was consulted and pt was started on keppra. Hospitalist consulted for admission.  Assessment & Plan:   Principal Problem:   Aphasia Active Problems:   Primary brain tumor ([place])   Anxiety disorder   Essential hypertension   Hypothyroidism, acquired   IDA (iron deficiency anemia)   1. Brain Mass: - Suspect primary malignancy. - MRI brain reviewed. Findings of 1.8cm x 2.1cm x 1.6cm L parietal mass with surrounding edema - Reviewed CT chest and abd/pelvis.   -CT chest, abdomen and pelvis with contrast reviewed. Findings only of small pulm nodules in R lung with recs for no f/u if pt low risk and repeat imaging in 63mos if high risk -PSA 0.75 -Neurosurgery had been following. Since signed off. Recs to continue keppra and decadron - discussed case with neurosurgery. Difficult situation. Repeat MRI underway. Unclear if biopsy/aggressive management would be desired. Dr. Ellene Route to discuss further with family.   2. Hypertension: - Noted to be hypertnsive at admission - BP currently stable  3. Hypothyroidism: -Will continue levothyroxine - stable for now  DVT prophylaxis: Levoenox Code Status: Full Family Communication: Pt in room, family not at bedside Disposition Plan: Uncertain at  this time  Consultants:   Neurology   Neurosurgery  Procedures:     Antimicrobials: Anti-infectives    None      Subjective: Without complaints  Objective: Vitals:   01/31/17 0622 01/31/17 0639 01/31/17 0911 01/31/17 1405  BP: (!) 166/94 (!) 158/90 (!) 144/84 106/62  Pulse: 89 86 100 67  Resp: (!) 22  20 20   Temp:   98.2 F (36.8 C) 98 F (36.7 C)  TempSrc:   Oral Oral  SpO2: 96% 95% 99% 95%  Weight:      Height:        Intake/Output Summary (Last 24 hours) at 01/31/17 1549 Last data filed at 01/31/17 0654  Gross per 24 hour  Intake            667.5 ml  Output              650 ml  Net             17.5 ml   Filed Weights   01/28/17 1809 01/28/17 2200  Weight: 65.7 kg (144 lb 13.5 oz) 66.1 kg (145 lb 11.2 oz)    Examination:  General exam: Laying in bed, in nad Respiratory system: normal chest rise, no audible wheezing Cardiovascular system: regular rate, s1-s2 Gastrointestinal system: soft, nondistended, pos BS Central nervous system: no seizures, no tremors. Extremities: no clubbing, no cyanosis Skin: no pallor, no rashes Psychiatry: mood normal// no visual hallucinations.   Data Reviewed: I have personally reviewed following labs and imaging studies  CBC:  Recent Labs Lab 01/28/17 1744 01/28/17 1749  WBC 8.5  --   NEUTROABS 7.4  --   HGB 12.5* 12.2*  HCT 36.7* 36.0*  MCV 84.8  --   PLT 186  --    Basic Metabolic Panel:  Recent Labs Lab 01/28/17 1744 01/28/17 1749 01/29/17 0442  NA 136 139 136  K 4.4 4.4 3.8  CL 103 101 104  CO2 26  --  26  GLUCOSE 117* 117* 104*  BUN 21* 26* 17  CREATININE 1.06 1.10 0.96  CALCIUM 9.0  --  8.3*   GFR: Estimated Creatinine Clearance: 53.6 mL/min (by C-G formula based on SCr of 0.96 mg/dL). Liver Function Tests:  Recent Labs Lab 01/28/17 1744  AST 29  ALT 16*  ALKPHOS 47  BILITOT 1.0  PROT 5.8*  ALBUMIN 3.5   No results for input(s): LIPASE, AMYLASE in the last 168 hours. No  results for input(s): AMMONIA in the last 168 hours. Coagulation Profile:  Recent Labs Lab 01/28/17 1744  INR 1.07   Cardiac Enzymes: No results for input(s): CKTOTAL, CKMB, CKMBINDEX, TROPONINI in the last 168 hours. BNP (last 3 results) No results for input(s): PROBNP in the last 8760 hours. HbA1C: No results for input(s): HGBA1C in the last 72 hours. CBG:  Recent Labs Lab 01/28/17 1907  GLUCAP 86   Lipid Profile: No results for input(s): CHOL, HDL, LDLCALC, TRIG, CHOLHDL, LDLDIRECT in the last 72 hours. Thyroid Function Tests: No results for input(s): TSH, T4TOTAL, FREET4, T3FREE, THYROIDAB in the last 72 hours. Anemia Panel: No results for input(s): VITAMINB12, FOLATE, FERRITIN, TIBC, IRON, RETICCTPCT in the last 72 hours. Sepsis Labs: No results for input(s): PROCALCITON, LATICACIDVEN in the last 168 hours.  No results found for this or any previous visit (from the past 240 hour(s)).   Radiology Studies: No results found.  Scheduled Meds: . dexamethasone  4 mg Intravenous Q6H  . enoxaparin (LOVENOX) injection  40 mg Subcutaneous Q24H  . levETIRAcetam  750 mg Intravenous Q12H  . LORazepam  1 mg Intravenous Once   Continuous Infusions:   LOS: 2 days   Jakiah Goree, Orpah Melter, MD [facility] Pager [phone]  If 7PM-7AM, please contact night-coverage [email or web address] Password [facility] 01/31/2017, 3:49 PM

## 2017-02-01 LAB — GLUCOSE, CAPILLARY
Glucose-Capillary: 124 mg/dL — ABNORMAL HIGH (ref 65–99)
Glucose-Capillary: 108 mg/dL — ABNORMAL HIGH (ref 65–99)

## 2017-02-01 MED ORDER — DEXAMETHASONE SODIUM PHOSPHATE 10 MG/ML IJ SOLN
4.0000 mg | Freq: Four times a day (QID) | INTRAMUSCULAR | Status: DC
  Administered 2017-02-02 – 2017-02-03 (×7): 4 mg via INTRAVENOUS
  Filled 2017-02-01 (×6): qty 1

## 2017-02-01 NOTE — Patient Care Conference (Signed)
Met with patient's spouse and son at bedside. Updated on plan of care. All questions answered. Pt's son and wife state that patient's known wishes are for DNR/DNI status, confirmed in living will per son. DNR orders placed.

## 2017-02-01 NOTE — Progress Notes (Signed)
PROGRESS NOTE    [name]  [id number] DOB: 1930-11-13 DOA: 01/28/2017 PCP: Gilford Rile, MD    Brief Narrative:  83 y.o.malewith a past medical history significant for HTN not on medication, and hypothyroidismwho presents with aphasia.  Caveat that patient is unable to provide history, so all history collected from chart. The patient was in his usual state of health today and LSN around noon by his wife, and then around 5PM was encountered by a friend who noticed he was "not talking" and "having difficulty speaking", and so he was brought in as a [place].   In the ED, pt was found to have CT findings of an indistinct L parietal lesion favored to be a brain mass. Neurology was consulted and pt was started on keppra. Hospitalist consulted for admission.  Assessment & Plan:   Principal Problem:   Aphasia Active Problems:   Primary brain tumor ([place])   Anxiety disorder   Essential hypertension   Hypothyroidism, acquired   IDA (iron deficiency anemia)   1. Brain Mass: - Suspect primary malignancy. - MRI brain reviewed. Findings of 1.8cm x 2.1cm x 1.6cm L parietal mass with surrounding edema - Reviewed CT chest and abd/pelvis.   -CT chest, abdomen and pelvis with contrast reviewed. Findings only of small pulm nodules in R lung with recs for no f/u if pt low risk and repeat imaging in 31mos if high risk -PSA 0.75 -Neurosurgery had been following. Since signed off. Recs to continue keppra and decadron - discussed case with neurosurgery. Difficult situation.  - Pt was unable to tolerate repeat MRI - Neurosurgery to continue discussion w/ family regarding wanting/desiring more aggressive work up - PT/OT consulted. Recs for SNF. Consulted SW  2. Hypertension: - Noted to be hypertnsive at admission - BP currently stable - Continue to monitor  3. Hypothyroidism: -Will continue levothyroxine - Remains stable for now  DVT prophylaxis: Levoenox Code Status:  Full Family Communication: Pt in room, family not at bedside Disposition Plan: Uncertain at this time  Consultants:   Neurology   Neurosurgery  Procedures:     Antimicrobials: Anti-infectives    None      Subjective: Without complaints at present  Objective: Vitals:   02/01/17 0100 02/01/17 0500 02/01/17 0935 02/01/17 1358  BP: (!) 181/96 (!) 152/76 (!) 150/70 (!) 151/87  Pulse: 79 71 93 95  Resp: 20 18 18 18   Temp: 97.7 F (36.5 C) 97.5 F (36.4 C) 98.7 F (37.1 C) 98.4 F (36.9 C)  TempSrc: Oral Oral Oral Oral  SpO2: 95% 97% 93% 98%  Weight:      Height:        Intake/Output Summary (Last 24 hours) at 02/01/17 1559 Last data filed at 01/31/17 2155  Gross per 24 hour  Intake            182.5 ml  Output              675 ml  Net           -492.5 ml   Filed Weights   01/28/17 1809 01/28/17 2200  Weight: 65.7 kg (144 lb 13.5 oz) 66.1 kg (145 lb 11.2 oz)    Examination:  General exam: Laying in bed, in nad, conversant and following commands Respiratory system: normal resp effort, no audible wheezing Cardiovascular system: regular rhythm, s1-2 on auscultation Gastrointestinal system: pos BS, nondistended, nontender Central nervous system: cn2-12 grossly intact, strength intact Extremities: perfused, no joint deformities Skin: normal skin turgor,  no notable skin lesions seen Psychiatry: affect normal// no auditory hallucinations.   Data Reviewed: I have personally reviewed following labs and imaging studies  CBC:  Recent Labs Lab 01/28/17 1744 01/28/17 1749  WBC 8.5  --   NEUTROABS 7.4  --   HGB 12.5* 12.2*  HCT 36.7* 36.0*  MCV 84.8  --   PLT 186  --    Basic Metabolic Panel:  Recent Labs Lab 01/28/17 1744 01/28/17 1749 01/29/17 0442  NA 136 139 136  K 4.4 4.4 3.8  CL 103 101 104  CO2 26  --  26  GLUCOSE 117* 117* 104*  BUN 21* 26* 17  CREATININE 1.06 1.10 0.96  CALCIUM 9.0  --  8.3*   GFR: Estimated Creatinine Clearance: 53.6  mL/min (by C-G formula based on SCr of 0.96 mg/dL). Liver Function Tests:  Recent Labs Lab 01/28/17 1744  AST 29  ALT 16*  ALKPHOS 47  BILITOT 1.0  PROT 5.8*  ALBUMIN 3.5   No results for input(s): LIPASE, AMYLASE in the last 168 hours. No results for input(s): AMMONIA in the last 168 hours. Coagulation Profile:  Recent Labs Lab 01/28/17 1744  INR 1.07   Cardiac Enzymes: No results for input(s): CKTOTAL, CKMB, CKMBINDEX, TROPONINI in the last 168 hours. BNP (last 3 results) No results for input(s): PROBNP in the last 8760 hours. HbA1C: No results for input(s): HGBA1C in the last 72 hours. CBG:  Recent Labs Lab 01/28/17 1907 02/01/17 0341 02/01/17 1115  GLUCAP 86 124* 108*   Lipid Profile: No results for input(s): CHOL, HDL, LDLCALC, TRIG, CHOLHDL, LDLDIRECT in the last 72 hours. Thyroid Function Tests: No results for input(s): TSH, T4TOTAL, FREET4, T3FREE, THYROIDAB in the last 72 hours. Anemia Panel: No results for input(s): VITAMINB12, FOLATE, FERRITIN, TIBC, IRON, RETICCTPCT in the last 72 hours. Sepsis Labs: No results for input(s): PROCALCITON, LATICACIDVEN in the last 168 hours.  No results found for this or any previous visit (from the past 240 hour(s)).   Radiology Studies: No results found.  Scheduled Meds: . dexamethasone  4 mg Intravenous Q6H  . enoxaparin (LOVENOX) injection  40 mg Subcutaneous Q24H  . levETIRAcetam  750 mg Intravenous Q12H   Continuous Infusions:   LOS: 3 days   Peretz Thieme, Orpah Melter, MD [facility] Pager [phone]  If 7PM-7AM, please contact night-coverage [email or web address] Password [email or web address] 02/01/2017, 3:59 PM

## 2017-02-01 NOTE — Evaluation (Addendum)
Occupational Therapy Evaluation Patient Details Name: Calvin Berry MRN: [id number] DOB: 07-01-1930 Today's Date: 02/01/2017    History of Present Illness Calvin Berry is a 83 y.o. male with a past medical history significant for HTN not on medication, and hypothyroidism who presents with aphasia. Patient had an MRI which was of poor quality secondary to motion degradation but it demonstrates a lesion measuring approximately 2 cm in the left parietal region. There is a substantial amount of edema around this lesion and this appears to be most consistent with either a metastatic lesion or primary brain tumor.   Clinical Impression   Limited evaluation performed secondary to pt confusion, intermittent agitation, and restlessness requiring 4 point restrains, posey belt, and mittens. Pt able to scoot self up in bed and perform self feeding and grooming at bed level with min assist. Pt requires consistent cues for safety and sequencing of ADL tasks. Pt presenting with impaired cognition, impaired communication, visual deficits, and generalized weakness impacting his independence and safety with ADL and functional mobility. Recommending SNF for follow up to maximize independence and safety with ADL and functional mobility prior to return home. Pt would benefit from continued skilled OT to address established goals.    Follow Up Recommendations  SNF;Supervision/Assistance - 24 hour    Equipment Recommendations  Other (comment) (TBD at next venue)    Recommendations for Other Services       Precautions / Restrictions Precautions Precautions: Fall Restrictions Weight Bearing Restrictions: No      Mobility Bed Mobility               General bed mobility comments: Pt able to scoot self up in bed with min guard  Transfers                      Balance                                            ADL Overall ADL's : Needs  assistance/impaired Eating/Feeding: Minimal assistance;Bed level;Cueing for sequencing;Cueing for safety Eating/Feeding Details (indicate cue type and reason): Cues throughout for sequencing Grooming: Minimal assistance;Bed level;Wash/dry face                                 General ADL Comments: Limited evaluation performed; pt in 4 point restraints, posey belt, and mittens. Per RN, pt intermittently agitated and constantly restless and confused. Pt unsafe to attempt mobililzation with +1 assist at this time. Pt able to scoot self up in bed with min guard assist.     Vision Additional Comments: Difficult to assess due to impaired communication/cognition. Pt only attending to items on R side of lunch tray, with cues able to cross midline.   Perception     Praxis      Pertinent Vitals/Pain Pain Assessment: Faces Faces Pain Scale: No hurt     Hand Dominance Right   Extremity/Trunk Assessment Upper Extremity Assessment Upper Extremity Assessment: Generalized weakness;Difficult to assess due to impaired cognition (active movement bil UEs noted)   Lower Extremity Assessment Lower Extremity Assessment: Defer to PT evaluation       Communication Communication Communication: Receptive difficulties;Expressive difficulties   Cognition Arousal/Alertness: Awake/alert Behavior During Therapy: Restless;Flat affect;Impulsive Overall Cognitive Status: Impaired/Different from baseline Area of Impairment: Following  commands;Safety/judgement;Awareness;Problem solving;Attention   Current Attention Level: Sustained   Following Commands: Follows one step commands inconsistently Safety/Judgement: Decreased awareness of safety;Decreased awareness of deficits Awareness: Intellectual Problem Solving: Requires verbal cues;Requires tactile cues General Comments: Pt able to follow commands for self feeding. Easily distracted and requires cues throughout to maintain attention to task.    General Comments       Exercises       Shoulder Instructions      Home Living Family/patient expects to be discharged to:: Private residence Living Arrangements: Children Available Help at Discharge: Family Type of Home: House Home Access: Level entry     Home Layout: One level;Laundry or work area in basement     [employer] Shower/Tub: Teacher, early years/pre: Standard         Additional Comments: wife lives in separate house, but lives with son. Information obtained from PT eval; pt unable to provide and no family present      Prior Functioning/Environment Level of Independence: Independent        Comments: Pt unable to provide information and no family present, information taken from PT eval        OT Problem List: Decreased strength;Decreased activity tolerance;Impaired balance (sitting and/or standing);Impaired vision/perception;Decreased coordination;Decreased cognition;Decreased safety awareness;Decreased knowledge of use of DME or AE;Decreased knowledge of precautions   OT Treatment/Interventions: Self-care/ADL training;Therapeutic exercise;Neuromuscular education;Energy conservation;DME and/or AE instruction;Therapeutic activities;Cognitive remediation/compensation;Visual/perceptual remediation/compensation;Patient/family education;Balance training    OT Goals(Current goals can be found in the care plan section) Acute Rehab OT Goals Patient Stated Goal: none stated OT Goal Formulation: Patient unable to participate in goal setting Time For Goal Achievement: 02/15/17 Potential to Achieve Goals: Fair ADL Goals Pt Will Perform Eating: with supervision;sitting Pt Will Perform Grooming: with supervision;sitting Pt Will Transfer to Toilet: with min assist;ambulating;bedside commode Additional ADL Goal #1: Pt will attend to items in L visual field 75% of the time with min cues. Additional ADL Goal #2: Pt will follow one step command 100% of the time in  a minimally distracting environment.  OT Frequency: Min 2X/week   Barriers to D/C:            Co-evaluation              End of Session Nurse Communication: Mobility status  Activity Tolerance: Treatment limited secondary to agitation Patient left: in bed;with call bell/phone within reach;with bed alarm set;with nursing/sitter in room;with restraints reapplied   Time: [id number] OT Time Calculation (min): 24 min Charges:  OT General Charges $OT Visit: 1 Procedure OT Evaluation $OT Eval Moderate Complexity: 1 Procedure OT Treatments $Self Care/Home Management : 8-22 mins G-Codes:     Binnie Kand M.S., OTR/L Pager: [phone]  02/01/2017, 2:29 PM

## 2017-02-02 ENCOUNTER — Encounter (HOSPITAL_COMMUNITY)
Admission: EM | Disposition: A | Discharge status: Skilled Nursing Facility | Payer: Self-pay | Source: Home / Self Care | Attending: Internal Medicine

## 2017-02-02 SURGERY — FRAMELESS STEREOTACTIC BIOPSY
Anesthesia: General | Laterality: Left

## 2017-02-02 NOTE — NC FL2 (Signed)
[place] MEDICAID FL2 LEVEL OF CARE SCREENING TOOL     IDENTIFICATION  Patient Name: Calvin Berry Birthdate: Oct 18, 1930 Sex: male Admission Date (Current Location): 01/28/2017  [facility] and [place] Number:  [employer] and Address:  The . [facility], [place] [address], [place], [place] [address]      Provider Number: [id number]  Attending Physician Name and Address:  Donne Hazel, MD  Relative Name and Phone Number:       Current Level of Care: Hospital Recommended Level of Care: [place] Prior Approval Number:    Date Approved/Denied:   PASRR Number: [id number]  Discharge Plan: SNF    Current Diagnoses: Patient Active Problem List   Diagnosis Date Noted  . Aphasia 01/28/2017  . Primary brain tumor ([place]) 01/28/2017  . Essential hypertension 02/25/2016  . Hypothyroidism, acquired 02/25/2016  . Anxiety disorder 09/13/2014  . IDA (iron deficiency anemia) 09/13/2014    Orientation RESPIRATION BLADDER Height & Weight     Self  Normal Incontinent Weight: 145 lb 11.2 oz (66.1 kg) Height:  5\' 7"  (170.2 cm)  BEHAVIORAL SYMPTOMS/MOOD NEUROLOGICAL BOWEL NUTRITION STATUS      Continent Diet (carb modified)  AMBULATORY STATUS COMMUNICATION OF NEEDS Skin   Extensive Assist Verbally Normal                       Personal Care Assistance Level of Assistance  Bathing, Dressing Bathing Assistance: Maximum assistance   Dressing Assistance: Maximum assistance     Functional Limitations Info             SPECIAL CARE FACTORS FREQUENCY  PT (By licensed PT), OT (By licensed OT)     PT Frequency: 5/wk OT Frequency: 5/wk            Contractures      Additional Factors Info  Code Status, Allergies Code Status Info: DNR Allergies Info: Statins           Current Medications (02/02/2017):  This is the current hospital active medication list Current Facility-Administered Medications  Medication Dose Route  Frequency Provider Last Rate Last Dose  . acetaminophen (TYLENOL) tablet 650 mg  650 mg Oral Q6H PRN Edwin Dada, MD       Or  . acetaminophen (TYLENOL) suppository 650 mg  650 mg Rectal Q6H PRN Edwin Dada, MD      . dexamethasone (DECADRON) injection 4 mg  4 mg Intravenous Q6H Donne Hazel, MD   4 mg at 02/02/17 0620  . enoxaparin (LOVENOX) injection 40 mg  40 mg Subcutaneous Q24H Edwin Dada, MD   40 mg at 01/31/17 2300  . ibuprofen (ADVIL,MOTRIN) tablet 400 mg  400 mg Oral Q6H PRN Edwin Dada, MD      . levETIRAcetam (KEPPRA) 750 mg in sodium chloride 0.9 % 100 mL IVPB  750 mg Intravenous Q12H Greta Doom, MD   750 mg at 02/02/17 [id number]  . LORazepam (ATIVAN) injection 0.5-1 mg  0.5-1 mg Intravenous Q4H PRN Kerney Elbe, MD   1 mg at 02/02/17 0148  . ondansetron (ZOFRAN) tablet 4 mg  4 mg Oral Q6H PRN Edwin Dada, MD       Or  . ondansetron (ZOFRAN) injection 4 mg  4 mg Intravenous Q6H PRN Edwin Dada, MD         Discharge Medications: Please see discharge summary for a list of discharge medications.  Relevant Imaging  Results:  Relevant Lab Results:   Additional Information SS#: [id number]  Jorge Ny, LCSW

## 2017-02-02 NOTE — Progress Notes (Signed)
Patient ID: Calvin Berry, male   DOB: 07-26-1930, 83 y.o.   MRN: [id number] Vital signs are stable Patient more loquacious but still with certain word finding difficulties and questionable intactness of comprehension At this point it appears best to simply observe the patient's status Things continued to worsen with his speech and likely the diagnosis is known After some discussion by Dr. chew is been decided conservative course of action and I'm in full agreement with this I will remain available to see him should the need arise for any consideration of reevaluation of his condition.

## 2017-02-02 NOTE — Care Management Important Message (Signed)
Important Message  Patient Details  Name: Calvin Berry MRN: [id number] Date of Birth: [birth year]   Medicare Important Message Given:  Yes    Orbie Pyo 02/02/2017, 4:39 PM

## 2017-02-02 NOTE — Progress Notes (Signed)
Physical Therapy Treatment Patient Details Name: Calvin Berry MRN: [id number] DOB: 11-22-1930 Today's Date: 02/02/2017    History of Present Illness Calvin Berry is a 83 y.o. male with a past medical history significant for HTN not on medication, and hypothyroidism who presents with aphasia. Patient had an MRI which was of poor quality secondary to motion degradation but it demonstrates a lesion measuring approximately 2 cm in the left parietal region. There is a substantial amount of edema around this lesion and this appears to be most consistent with either a metastatic lesion or primary brain tumor.    PT Comments    Pt ambulated 106' with bilateral HHA min A with mod instructional cues and guiding for safety. Pt was hallucinating during session (kept seeing roaches on ceiling) but was easily redirected and remained calm throughout as well as enjoying walking out of room and down hallway. PT will continue to follow.   Follow Up Recommendations  SNF;Supervision/Assistance - 24 hour;Supervision for mobility/OOB     Equipment Recommendations  Other (comment) (TBD)    Recommendations for Other Services       Precautions / Restrictions Precautions Precautions: Fall Restrictions Weight Bearing Restrictions: No    Mobility  Bed Mobility Overal bed mobility: Needs Assistance Bed Mobility: Supine to Sit;Sit to Supine     Supine to sit: Min assist Sit to supine: Min assist   General bed mobility comments: min A to initiate legs to EOB and then pt began to move on his own to edge. Did better with tactile cues than verbal. Min A for legs back into bed due to weakness  Transfers Overall transfer level: Needs assistance Equipment used: 2 person hand held assist Transfers: Sit to/from Stand Sit to Stand: Mod assist;+2 physical assistance         General transfer comment: increased time needed and mod A for power last 25% of standing  Ambulation/Gait Ambulation/Gait  assistance: Min assist;+2 physical assistance Ambulation Distance (Feet): 75 Feet Assistive device: 2 person hand held assist Gait Pattern/deviations: Ataxic;Trunk flexed Gait velocity: decreased Gait velocity interpretation: Below normal speed for age/gender General Gait Details: min A +2 for support and guiding and mod instructional cues for direction. Pt began to fatigue after about 50', slower and more ataxic last 25'   Stairs            Wheelchair Mobility    Modified Rankin (Stroke Patients Only) Modified Rankin (Stroke Patients Only) Pre-Morbid Rankin Score: No symptoms Modified Rankin: Moderately severe disability     Balance Overall balance assessment: Needs assistance Sitting-balance support: Feet supported Sitting balance-Leahy Scale: Fair Sitting balance - Comments: able to maintain sitting EOB but very impulsice and unsafe, close supervision needed at all times   Standing balance support: Bilateral upper extremity supported Standing balance-Leahy Scale: Poor Standing balance comment: UE support needed to maintain standing                    Cognition Arousal/Alertness: Awake/alert Behavior During Therapy: Impulsive;Flat affect Overall Cognitive Status: Impaired/Different from baseline Area of Impairment: Following commands;Safety/judgement;Awareness;Problem solving;Attention   Current Attention Level: Sustained   Following Commands: Follows one step commands inconsistently Safety/Judgement: Decreased awareness of safety;Decreased awareness of deficits Awareness: Intellectual Problem Solving: Requires verbal cues;Requires tactile cues General Comments: pt seeing roaches on ceiling and other things that weren't there throughout session. Easily redirected this session though    Exercises      General Comments General comments (skin integrity, edema, etc.): pt  confused and hallucinating but calm and easily directed throughout session today       Pertinent Vitals/Pain Pain Assessment: Faces Faces Pain Scale: No hurt    Home Living                      Prior Function            PT Goals (current goals can now be found in the care plan section) Acute Rehab PT Goals Patient Stated Goal: agreeable to getting OOB PT Goal Formulation: With family Time For Goal Achievement: 02/13/17 Potential to Achieve Goals: Fair Progress towards PT goals: Progressing toward goals    Frequency    Min 3X/week      PT Plan Current plan remains appropriate    Co-evaluation             End of Session Equipment Utilized During Treatment: Gait belt Activity Tolerance: Patient tolerated treatment well Patient left: in bed;with bed alarm set;with call bell/phone within reach     Time: 1311-1335 PT Time Calculation (min) (ACUTE ONLY): 24 min  Charges:  $Gait Training: 23-37 mins                    G Codes:     [place]  [place] 02/02/2017, 2:00 PM

## 2017-02-02 NOTE — Progress Notes (Signed)
Patient extremely agitated, labored breathing, pulling against waist and wrist restraints. Patient has redness bilateral wrist and waist. Restraints applied properly. Restraint removed, ROM preformed and patient began swinging and kicking at RN and NT. Patient attempting to break window and threatening staff. Patient very unsteady on feet. Patient placed in recliner. RN attempted to distract patient. Patient administered 1 mg IV ativan at 0144 once IV access was re-established. Patient back to bed, restraints and safety mitts applied properly. RN will continue to monitor.

## 2017-02-02 NOTE — Progress Notes (Signed)
PROGRESS NOTE    Calvin Berry  [id number] DOB: [birth year] DOA: 01/28/2017 PCP: Gilford Rile, MD    Brief Narrative:  83 y.o.malewith a past medical history significant for HTN not on medication, and hypothyroidismwho presents with aphasia.  Caveat that patient is unable to provide history, so all history collected from chart. The patient was in his usual state of health today and LSN around noon by his wife, and then around 5PM was encountered by a friend who noticed he was "not talking" and "having difficulty speaking", and so he was brought in as a [place].   In the ED, pt was found to have CT findings of an indistinct L parietal lesion favored to be a brain mass. Neurology was consulted and pt was started on keppra. Hospitalist consulted for admission.  Assessment & Plan:   Principal Problem:   Aphasia Active Problems:   Primary brain tumor ([place])   Anxiety disorder   Essential hypertension   Hypothyroidism, acquired   IDA (iron deficiency anemia)   1. Brain Mass: - Suspect primary malignancy. - MRI brain reviewed. Findings of 1.8cm x 2.1cm x 1.6cm L parietal mass with surrounding edema - Reviewed CT chest and abd/pelvis.   -CT chest, abdomen and pelvis with contrast reviewed. Findings only of small pulm nodules in R lung with recs for no f/u if pt low risk and repeat imaging in 68mos if high risk -PSA 0.75 -Neurosurgery had been following. Since signed off. Recs to continue keppra and decadron - discussed case with neurosurgery. Difficult situation.  - Pt was unable to tolerate repeat MRI - Family reports desiring conservative management only and supportive care.  -Discussed case with Neurosurgery who agrees with conservative treatment only and to avoid surgery/biopsy - PT/OT recs for SNF. Would recommend hospice services at SNF  2. Hypertension: - Noted to be hypertnsive at admission - BP presently stable - Continue to monitor  3.  Hypothyroidism: -Will continue levothyroxine - Remains stable for now  DVT prophylaxis: Levoenox Code Status: Full Family Communication: Pt in room, family not at bedside Disposition Plan: Uncertain at this time  Consultants:   Neurology   Neurosurgery  Procedures:     Antimicrobials: Anti-infectives    None      Subjective: No complaints  Objective: Vitals:   02/02/17 0700 02/02/17 0753 02/02/17 0921 02/02/17 1318  BP: (!) 165/103 (!) 148/92 (!) 163/84 (!) 147/85  Pulse: 90  95 87  Resp: 18  20 20   Temp:   97.6 F (36.4 C) 97.9 F (36.6 C)  TempSrc:   Oral Axillary  SpO2: 96%  94% 97%  Weight:      Height:        Intake/Output Summary (Last 24 hours) at 02/02/17 1559 Last data filed at 02/01/17 2100  Gross per 24 hour  Intake                0 ml  Output              250 ml  Net             -250 ml   Filed Weights   01/28/17 1809 01/28/17 2200  Weight: 65.7 kg (144 lb 13.5 oz) 66.1 kg (145 lb 11.2 oz)    Examination:  General exam: Awake, conversant, does follow commands Respiratory system: normal resp effort, no audible wheezing Cardiovascular system: regular rhythm, s1-2 on auscultation Gastrointestinal system: pos BS, nondistended, nontender Central nervous system: cn2-12 grossly intact, strength intact  Extremities: perfused, no joint deformities Skin: normal skin turgor, no notable skin lesions seen Psychiatry: affect normal// no auditory hallucinations.   Data Reviewed: I have personally reviewed following labs and imaging studies  CBC:  Recent Labs Lab 01/28/17 1744 01/28/17 1749  WBC 8.5  --   NEUTROABS 7.4  --   HGB 12.5* 12.2*  HCT 36.7* 36.0*  MCV 84.8  --   PLT 186  --    Basic Metabolic Panel:  Recent Labs Lab 01/28/17 1744 01/28/17 1749 01/29/17 0442  NA 136 139 136  K 4.4 4.4 3.8  CL 103 101 104  CO2 26  --  26  GLUCOSE 117* 117* 104*  BUN 21* 26* 17  CREATININE 1.06 1.10 0.96  CALCIUM 9.0  --  8.3*    GFR: Estimated Creatinine Clearance: 53.6 mL/min (by C-G formula based on SCr of 0.96 mg/dL). Liver Function Tests:  Recent Labs Lab 01/28/17 1744  AST 29  ALT 16*  ALKPHOS 47  BILITOT 1.0  PROT 5.8*  ALBUMIN 3.5   No results for input(s): LIPASE, AMYLASE in the last 168 hours. No results for input(s): AMMONIA in the last 168 hours. Coagulation Profile:  Recent Labs Lab 01/28/17 1744  INR 1.07   Cardiac Enzymes: No results for input(s): CKTOTAL, CKMB, CKMBINDEX, TROPONINI in the last 168 hours. BNP (last 3 results) No results for input(s): PROBNP in the last 8760 hours. HbA1C: No results for input(s): HGBA1C in the last 72 hours. CBG:  Recent Labs Lab 01/28/17 1907 02/01/17 0341 02/01/17 1115  GLUCAP 86 124* 108*   Lipid Profile: No results for input(s): CHOL, HDL, LDLCALC, TRIG, CHOLHDL, LDLDIRECT in the last 72 hours. Thyroid Function Tests: No results for input(s): TSH, T4TOTAL, FREET4, T3FREE, THYROIDAB in the last 72 hours. Anemia Panel: No results for input(s): VITAMINB12, FOLATE, FERRITIN, TIBC, IRON, RETICCTPCT in the last 72 hours. Sepsis Labs: No results for input(s): PROCALCITON, LATICACIDVEN in the last 168 hours.  No results found for this or any previous visit (from the past 240 hour(s)).   Radiology Studies: No results found.  Scheduled Meds: . dexamethasone  4 mg Intravenous Q6H  . enoxaparin (LOVENOX) injection  40 mg Subcutaneous Q24H  . levETIRAcetam  750 mg Intravenous Q12H   Continuous Infusions:   LOS: 4 days   CHIU, Orpah Melter, MD [facility] Pager [phone]  If 7PM-7AM, please contact night-coverage [email or web address] Password [email or web address] 02/02/2017, 3:59 PM

## 2017-02-02 NOTE — Progress Notes (Signed)
Speech Language Pathology Treatment: Dysphagia;Cognitive-Linquistic  Patient Details Name: Calvin Berry MRN: [id number] DOB: [birth year] Today's Date: 02/02/2017 Time: [id number] SLP Time Calculation (min) (ACUTE ONLY): 26 min  Assessment / Plan / Recommendation Clinical Impression  Pt continues to exhibit decreased sustained attention when consuming thin/Dysphagia 3 diet requiring minimal verbal cueing, but impulsivity has decreased from prior session; nursing stated they "supervised" pt while eating instead of need for feeding pt to ensure precautions were being followed for safe intake; pt's speech is intelligible, but hyponasal; continues to exhibit aphasia including neologisms, semantic paraphasias and intermittent confusion requiring restraints intermittently and/or a sitter in the room to ensure safety; oriented x2, but wife stated "he never knows the date." Educated pt/wife during session re: safety precautions for safe po intake and safety while in hospital setting; ST will continue to f/u while in house; D/C plans TBD.   HPI HPI: 83 y.o.malewith a past medical history significant for HTN not on medication, and hypothyroidismwho presents with aphasia. MRI showed mass lesion in the left parietal subcortical white matter with mineralization, gyral expansion, and small surrounding region of edema, concerning for neoplasm.      SLP Plan  Continue with current plan of care     Recommendations  Diet recommendations: Dysphagia 3 (mechanical soft);Thin liquid Liquids provided via: Cup;Straw Medication Administration: Whole meds with puree Supervision: Patient able to self feed;Full supervision/cueing for compensatory strategies Compensations: Minimize environmental distractions;Slow rate;Small sips/bites;Follow solids with liquid Postural Changes and/or Swallow Maneuvers: Seated upright 90 degrees;Upright 30-60 min after meal                Oral Care Recommendations: Oral care  BID Follow up Recommendations: Other (comment) Plan: Continue with current plan of care                       Nimrit Kehres,PAT, M.S., CCC-SLP 02/02/2017, 4:58 PM

## 2017-02-02 NOTE — Consult Note (Signed)
[facility] CM Primary Care Navigator  02/02/2017  Calvin Berry 08-03-1930 [id number]  Met with patient and wife Calvin Berry) at the bedside to identify possible discharge needs. Patient dozed on and off during this visit. He had episodes of being oriented to self but otherwise, confused with inconsistent verbalizations.   Patient's wife reports that a friend noticed that patient was "not talking" and "having difficulty speaking" that had led to this admission.   Patient's wife endorses  Dr. Gilford Rile with [address] [place]) as the primary care provider.    Patient shared using [facility] in [place] to obtain medications without any problem.   Patient was able to manage his own medications at home prior to admission. Wife states that son will assist patient with his medications at discharge.   Wife mentioned that patient drives before this admission. Wife and son Calvin Berry) will provide transportation to his doctors' appointments as needed.   Discharge plan is skilled nursing facility per therapy recommendation. Wife hopeful to go to [facility] which is closer to home. Patient lives with son but son works during the day. Patient's wife lives in a separate home and would not be capable of much physical assist.   Per Inpatient social worker, patient is not safe to return home until he is more independent .  Patient's wife voiced understanding to call primary care provider's office if patient returns home,  for a post discharge follow-up appointment within a week or sooner if needs arise. Patient letter (with PCP's contact number) was provided as a reminder.  Wife denies any other needs or concerns at this time.  For additional questions please contact:  Edwena Felty A. Lindyn Vossler, BSN, RN-BC [facility] PRIMARY CARE Navigator Cell: [phone]

## 2017-02-02 NOTE — Clinical Social Work Note (Signed)
Clinical Social Work Assessment  Patient Details  Name: Calvin Berry MRN: [id number] Date of Birth: May 04, 1930  Date of referral:  02/02/17               Reason for consult:  Facility Placement                Permission sought to share information with:  Family Supports Permission granted to share information::  No (pt disoriented)  Name::     Calvin Berry  Agency::  SNF  Relationship::  son, wife  Contact Information:     Housing/Transportation Living arrangements for the past 2 months:  Single Family Home Source of Information:  Adult Children Patient Interpreter Needed:  None Criminal Activity/Legal Involvement Pertinent to Current Situation/Hospitalization:  No - Comment as needed Significant Relationships:  Adult Children Lives with:  Adult Children (lives with son, wife lives in different house according to notes) Do you feel safe going back to the place where you live?  No Need for family participation in patient care:  Yes (Comment) (decision making)  Care giving concerns:  Per PT notes pt lives with son but son works during the day- wife lives in separate home and would not be capable of much physical assist.  Pt not safe to return home until more independent   Facilities manager / plan:  CSW spoke with pt son concerning PT recommendation for SNF.  Son aware of recommendation and thinks this is best for patient but defers final decision making to pt wife who CSW was unable to contact at time of assessment.  Employment status:  Retired Nurse, adult PT Recommendations:  [place] / Referral to community resources:  [place]  Patient/Family's Response to care:  Agreeable to SNF placement- hopeful for [place] so patient can be close to home.  Patient/Family's Understanding of and Emotional Response to Diagnosis, Current Treatment, and Prognosis:  No questions or concerns-  hopeful pt will recover mentally and physically.  Emotional Assessment Appearance:  Appears stated age Attitude/Demeanor/Rapport:  Combative Affect (typically observed):  Unable to Assess Orientation:  Oriented to Self Alcohol / Substance use:  Not Applicable Psych involvement (Current and /or in the community):  No (Comment)  Discharge Needs  Concerns to be addressed:  Care Coordination Readmission within the last 30 days:  No Current discharge risk:  Physical Impairment Barriers to Discharge:  Continued Medical Work up   Jorge Ny, LCSW 02/02/2017, 10:22 AM

## 2017-02-03 DIAGNOSIS — I10 Essential (primary) hypertension: Secondary | ICD-10-CM | POA: Diagnosis not present

## 2017-02-03 DIAGNOSIS — R440 Auditory hallucinations: Secondary | ICD-10-CM | POA: Diagnosis not present

## 2017-02-03 DIAGNOSIS — R531 Weakness: Secondary | ICD-10-CM | POA: Diagnosis not present

## 2017-02-03 DIAGNOSIS — R4701 Aphasia: Secondary | ICD-10-CM | POA: Diagnosis not present

## 2017-02-03 DIAGNOSIS — E039 Hypothyroidism, unspecified: Secondary | ICD-10-CM | POA: Diagnosis not present

## 2017-02-03 DIAGNOSIS — C719 Malignant neoplasm of brain, unspecified: Secondary | ICD-10-CM | POA: Diagnosis not present

## 2017-02-03 DIAGNOSIS — F419 Anxiety disorder, unspecified: Secondary | ICD-10-CM | POA: Diagnosis not present

## 2017-02-03 DIAGNOSIS — D496 Neoplasm of unspecified behavior of brain: Secondary | ICD-10-CM | POA: Diagnosis not present

## 2017-02-03 DIAGNOSIS — D509 Iron deficiency anemia, unspecified: Secondary | ICD-10-CM | POA: Diagnosis not present

## 2017-02-03 MED ORDER — DEXAMETHASONE 4 MG PO TABS: 4.0000 mg | ORAL_TABLET | Freq: Four times a day (QID) | ORAL | 0 refills | Status: AC

## 2017-02-03 MED ORDER — LEVETIRACETAM 750 MG PO TABS: 750.0000 mg | ORAL_TABLET | Freq: Two times a day (BID) | ORAL | 0 refills | Status: AC

## 2017-02-03 MED ORDER — IBUPROFEN 400 MG PO TABS: 400.0000 mg | ORAL_TABLET | Freq: Four times a day (QID) | ORAL | 0 refills | Status: AC | PRN

## 2017-02-03 NOTE — Clinical Social Work Placement (Signed)
   CLINICAL SOCIAL WORK PLACEMENT  NOTE  Date:  02/03/2017  Patient Details  Name: Calvin Berry MRN: [id number] Date of Birth: August 22, 1930  Clinical Social Work is seeking post-discharge placement for this patient at the [place] level of care (*CSW will initial, date and re-position this form in  chart as items are completed):  Yes   Patient/family provided with [place] Work Department's list of facilities offering this level of care within the geographic area requested by the patient (or if unable, by the patient's family).  Yes   Patient/family informed of their freedom to choose among providers that offer the needed level of care, that participate in Medicare, Medicaid or managed care program needed by the patient, have an available bed and are willing to accept the patient.  Yes   Patient/family informed of [facility]'s ownership interest in [facility] and [facility], as well as of the fact that they are under no obligation to receive care at these facilities.  PASRR submitted to EDS on 02/02/17     PASRR number received on 02/02/17     Existing PASRR number confirmed on       FL2 transmitted to all facilities in geographic area requested by pt/family on 02/02/17     FL2 transmitted to all facilities within larger geographic area on       Patient informed that his/her managed care company has contracts with or will negotiate with certain facilities, including the following:        Yes   Patient/family informed of bed offers received.  Patient chooses bed at [facility] and [facility] recommends and patient chooses bed at      Patient to be transferred to [facility] on 02/03/17.  Patient to be transferred to facility by ptar     Patient family notified on 02/03/17 of transfer.  Name of family member notified:  [name]     PHYSICIAN Please sign FL2, Please sign DNR     Additional Comment:     _______________________________________________ Jorge Ny, LCSW 02/03/2017, 1:17 PM

## 2017-02-03 NOTE — Progress Notes (Signed)
Patient will discharge to [facility] Anticipated discharge date: 2/6 Family notified: pt wife, son, and dtr Transportation by [employer]- scheduled at 2:15pm  CSW signing off.  Jorge Ny, LCSW Clinical Social Worker [phone]

## 2017-02-03 NOTE — Discharge Summary (Signed)
Physician Discharge Summary  Calvin Berry [id number] DOB: 1930-07-04 DOA: 01/28/2017  PCP: Calvin Rile, MD  Admit date: 01/28/2017 Discharge date: 02/03/2017  Admitted From: Home Disposition:  SNF  Recommendations for Outpatient Follow-up:  1. Follow up with PCP in 1-2 2. May consider repeat CT chest in 21mos to 1 year to evaluate small pulm nodule 3. Recommend Palliative Care consultation at SNF   Discharge Condition:Stable CODE STATUS:DNR Diet recommendation: Dysphagia 3 with thin liquids   Brief/Interim Summary: 83 y.o.malewith a past medical history significant for HTN not on medication, and hypothyroidismwho presents with aphasia.  Caveat that patient is unable to provide history, so all history collected from chart. The patient was in his usual state of health today and LSN around noon by his wife, and then around 5PM was encountered by a friend who noticed he was "not talking" and "having difficulty speaking", and so he was brought in as a [place].   In the ED, pt was found to have CT findings of an indistinct L parietal lesion favored to be a brain mass. Neurology was consulted and pt was started on keppra. Hospitalist consulted for admission.  1. Brain Mass: - Suspect primary malignancy. - MRI brain reviewed. Findings of 1.8cm x 2.1cm x 1.6cm L parietal mass with surrounding edema - Reviewed CT chest and abd/pelvis.  -CT chest,abdomen and pelvis with contrast reviewed. Findings only of small pulm nodules in R lung with recs for no f/u if pt low risk and repeat imaging in 52mos if high risk -PSA 0.75 -Neurosurgery had been following. Since signed off. Recs to continue keppra and decadron - discussed case with neurosurgery. Difficult situation.  - Pt was unable to tolerate repeat MRI - Family reports desiring conservative management only and supportive care.  -Discussed case with Neurosurgery who agrees with conservative treatment only and to avoid  surgery/biopsy - PT/OT recs for SNF. Would recommend hospice services at SNF  2. Hypertension: - Noted to be hypertnsive at admission - BP presently stable - Continue to monitor  3. Hypothyroidism: -Will continuelevothyroxine - Remains stable for now  Discharge Diagnoses:  Principal Problem:   Aphasia Active Problems:   Primary brain tumor ([place])   Anxiety disorder   Essential hypertension   Hypothyroidism, acquired   IDA (iron deficiency anemia)    Discharge Instructions   Allergies as of 02/03/2017      Reactions   Statins Other (See Comments)   Unknown      Medication List    STOP taking these medications   aspirin 325 MG tablet   VITAMIN B12 PO     TAKE these medications   dexamethasone 4 MG tablet Commonly known as:  DECADRON Take 1 tablet (4 mg total) by mouth every 6 (six) hours.   Fish Oil 1000 MG Caps Take by mouth.   ibuprofen 400 MG tablet Commonly known as:  ADVIL,MOTRIN Take 1 tablet (400 mg total) by mouth every 6 (six) hours as needed for moderate pain.   levETIRAcetam 750 MG tablet Commonly known as:  KEPPRA Take 1 tablet (750 mg total) by mouth 2 (two) times daily.   levothyroxine 50 MCG tablet Commonly known as:  SYNTHROID, LEVOTHROID Take 50 mcg by mouth daily.   multivitamin with minerals Tabs tablet Take 1 tablet by mouth daily.   MUSCLE RUB EX Apply topically.       Allergies  Allergen Reactions  . Statins Other (See Comments)    Unknown    Consultations:  Neurology  Neurosurgery  Procedures/Studies: Ct Chest W Contrast  Result Date: 01/29/2017 CLINICAL DATA:  Brain mass. EXAM: CT CHEST, ABDOMEN, AND PELVIS WITH CONTRAST TECHNIQUE: Multidetector CT imaging of the chest, abdomen and pelvis was performed following the standard protocol during bolus administration of intravenous contrast. CONTRAST:  161mL ISOVUE-300 IOPAMIDOL (ISOVUE-300) INJECTION 61% COMPARISON:  None. FINDINGS: CT CHEST FINDINGS Cardiovascular:  Normal heart size. Aortic atherosclerosis. Calcification within the LAD coronary artery noted. Mediastinum/Nodes: The trachea appears patent and is midline. Normal appearance of the esophagus. No mediastinal or hilar adenopathy. No axillary adenopathy. Lungs/Pleura: Dependent changes and subsegmental atelectasis are noted within bilateral posterior lung bases. Small nodule in the right apex measures 4 mm, image 13 of series 205. 3 mm right upper lobe lung nodule is identified, image 47 of series 205. Musculoskeletal: Scoliosis and degenerative disc disease noted. Chronic left posterior rib fracture deformities are noted. No aggressive lytic or sclerotic bone lesions. CT ABDOMEN PELVIS FINDINGS Hepatobiliary: 2 cm cyst within right lobe of liver noted, image 48 of series 301. Vicarious excretion of contrast into the gallbladder noted. No biliary dilatation. Pancreas: Unremarkable. No pancreatic ductal dilatation or surrounding inflammatory changes. Spleen: Normal in size without focal abnormality. Adrenals/Urinary Tract: Normal adrenal glands. Nonobstructing calculus within the inferior pole the right kidney measures 4 mm, image 71 of series 301. The left kidney appears normal. Bladder appears normal. Stomach/Bowel: The stomach appears normal. The small bowel loops have a normal course and caliber. Distal colonic diverticula noted without acute inflammation. Vascular/Lymphatic: Aortic atherosclerosis. No enlarged abdominal or pelvic lymph nodes. Reproductive: Fiducial markers noted within the prostate gland. Other: No abdominal wall hernia or abnormality. No abdominopelvic ascites. Musculoskeletal: Nonaggressive appearing sclerotic lesion within the right iliac bone is identified, image number 91 of series 301. No aggressive lytic or sclerotic bone lesions identified. Degenerative disc disease is identified at L4-5 and L5-S1. IMPRESSION: 1. No acute findings identified within the chest, abdomen or pelvis. No mass or  adenopathy identified. 2. Small pulmonary nodules are identified within the right lung. No follow-up needed if patient is low-risk (and has no known or suspected primary neoplasm). Non-contrast chest CT can be considered in 12 months if patient is high-risk. This recommendation follows the consensus statement: Guidelines for Management of Incidental Pulmonary Nodules Detected on CT Images: From the Fleischner Society 2017; Radiology 2017; 284:228-243. 3. Aortic atherosclerosis and coronary artery calcification 4. Right renal calculus. Electronically Signed   By: Kerby Moors M.D.   On: 01/29/2017 08:49   Mr Jeri Cos And Wo Contrast  Result Date: 01/28/2017 CLINICAL DATA:  83 y/o  M; left parietal brain lesion. EXAM: MRI HEAD WITHOUT AND WITH CONTRAST TECHNIQUE: Multiplanar, multiecho pulse sequences of the brain and surrounding structures were obtained without and with intravenous contrast. CONTRAST:  64mL MULTIHANCE GADOBENATE DIMEGLUMINE 529 MG/ML IV SOLN COMPARISON:  01/28/2017 and 03/26/2011 CT of the head. FINDINGS: Brain: No diffusion signal abnormality to suggest acute or early subacute infarct. Background of few foci of T2 FLAIR hyperintense signal abnormality in white matter compatible with mild chronic microvascular ischemic changes and mild brain parenchymal volume loss. No hydrocephalus. No extra-axial collection. Mass lesion in the left parietal lobe subcortical white matter measuring approximately 18 x 21 x 16 mm (AP x ML x CC series 5, image 19 and series 12, image 36). The mass is mildly T2 hyperintense and predominantly T1 hypointense. There are foci of T1 hyperintensity within the lesion with faint susceptibility blooming corresponding to calcifications on the prior CT  of the head. After administration of intravenous contrast there is no appreciable enhancement. Within white matter surrounding the mass in the left parietal lobe there is T2 FLAIR hyperintense signal abnormality. The overlying  cortical ribbon is intact. Vascular: Normal flow voids. Skull and upper cervical spine: Normal marrow signal. Sinuses/Orbits: No acute finding. Bilateral intra-ocular lens replacement. Other: None. IMPRESSION: Mass lesion in the left parietal subcortical white matter with mineralization, gyral expansion, and small surrounding region of edema. No appreciable enhancement. No diffusion restriction. Intact overlying cortical ribbon. Findings are atypical for infarction or hemorrhage and probably represent neoplasm such as a hypoenhancing metastasis or glial/oligoglial neoplasm. Electronically Signed   By: Kristine Garbe M.D.   On: 01/28/2017 21:34   Ct Abdomen Pelvis W Contrast  Result Date: 01/29/2017 CLINICAL DATA:  Brain mass. EXAM: CT CHEST, ABDOMEN, AND PELVIS WITH CONTRAST TECHNIQUE: Multidetector CT imaging of the chest, abdomen and pelvis was performed following the standard protocol during bolus administration of intravenous contrast. CONTRAST:  183mL ISOVUE-300 IOPAMIDOL (ISOVUE-300) INJECTION 61% COMPARISON:  None. FINDINGS: CT CHEST FINDINGS Cardiovascular: Normal heart size. Aortic atherosclerosis. Calcification within the LAD coronary artery noted. Mediastinum/Nodes: The trachea appears patent and is midline. Normal appearance of the esophagus. No mediastinal or hilar adenopathy. No axillary adenopathy. Lungs/Pleura: Dependent changes and subsegmental atelectasis are noted within bilateral posterior lung bases. Small nodule in the right apex measures 4 mm, image 13 of series 205. 3 mm right upper lobe lung nodule is identified, image 47 of series 205. Musculoskeletal: Scoliosis and degenerative disc disease noted. Chronic left posterior rib fracture deformities are noted. No aggressive lytic or sclerotic bone lesions. CT ABDOMEN PELVIS FINDINGS Hepatobiliary: 2 cm cyst within right lobe of liver noted, image 48 of series 301. Vicarious excretion of contrast into the gallbladder noted. No  biliary dilatation. Pancreas: Unremarkable. No pancreatic ductal dilatation or surrounding inflammatory changes. Spleen: Normal in size without focal abnormality. Adrenals/Urinary Tract: Normal adrenal glands. Nonobstructing calculus within the inferior pole the right kidney measures 4 mm, image 71 of series 301. The left kidney appears normal. Bladder appears normal. Stomach/Bowel: The stomach appears normal. The small bowel loops have a normal course and caliber. Distal colonic diverticula noted without acute inflammation. Vascular/Lymphatic: Aortic atherosclerosis. No enlarged abdominal or pelvic lymph nodes. Reproductive: Fiducial markers noted within the prostate gland. Other: No abdominal wall hernia or abnormality. No abdominopelvic ascites. Musculoskeletal: Nonaggressive appearing sclerotic lesion within the right iliac bone is identified, image number 91 of series 301. No aggressive lytic or sclerotic bone lesions identified. Degenerative disc disease is identified at L4-5 and L5-S1. IMPRESSION: 1. No acute findings identified within the chest, abdomen or pelvis. No mass or adenopathy identified. 2. Small pulmonary nodules are identified within the right lung. No follow-up needed if patient is low-risk (and has no known or suspected primary neoplasm). Non-contrast chest CT can be considered in 12 months if patient is high-risk. This recommendation follows the consensus statement: Guidelines for Management of Incidental Pulmonary Nodules Detected on CT Images: From the Fleischner Society 2017; Radiology 2017; 284:228-243. 3. Aortic atherosclerosis and coronary artery calcification 4. Right renal calculus. Electronically Signed   By: Kerby Moors M.D.   On: 01/29/2017 08:49   Ct Head Code Stroke W/o Cm  Result Date: 01/28/2017 CLINICAL DATA:  Code stroke. Acute presentation with speech disturbance and visual disturbance. EXAM: CT HEAD WITHOUT CONTRAST TECHNIQUE: Contiguous axial images were obtained  from the base of the skull through the vertex without intravenous contrast. COMPARISON:  Head CT 03/26/2011 FINDINGS: Brain: Brainstem and cerebellum are unremarkable. Right cerebral hemisphere appears normal for age with mild age related volume loss. On the left, there is a region of low density in the parietal region affecting the cortical and subcortical brain with areas of linear an indistinct hyperdensity, some of which is in the range highly suggestive of calcification rather than hemorrhage. Therefore, I am concerned about the possibility of a tumor in this location. The differential diagnosis would be recent infarction with mild hemorrhage, but that is not favored based on this CT. MRI would be suggested. Vascular: There is atherosclerotic calcification of the major vessels at the base of the brain. No focal hyperdense vessels. Skull: Negative Sinuses/Orbits: Clear/normal Other: None significant ASPECTS ([place] Stroke Program Early CT Score) Aspects not actually applicable in this case with suspected tumor. IMPRESSION: 1. Low-density in the left parietal region with linear and indistinct hyperdensity. This is favored to represent a mass lesion with calcification. The differential diagnosis does include recent infarction with hemorrhage, but that is not favored. MRI suggested. 2. These results were discussed by telephone at the time of interpretation on 01/28/2017 at 6:00 pm to Dr. Leonel Ramsay, who verbally acknowledged these results. Electronically Signed   By: Nelson Chimes M.D.   On: 01/28/2017 18:07    Subjective: Without complaints  Discharge Exam: Vitals:   02/03/17 0503 02/03/17 0855  BP: 119/74 136/78  Pulse: 68 100  Resp: (!) 22 20  Temp: 98.3 F (36.8 C) 97.7 F (36.5 C)   Vitals:   02/02/17 1732 02/02/17 2138 02/03/17 0503 02/03/17 0855  BP: (!) 151/82 (!) 168/94 119/74 136/78  Pulse: 86 76 68 100  Resp: 20 (!) 22 (!) 22 20  Temp: 97.6 F (36.4 C) 98.4 F (36.9 C) 98.3 F  (36.8 C) 97.7 F (36.5 C)  TempSrc: Oral Oral Oral Oral  SpO2: 98% 96% 93% 97%  Weight:      Height:        General: Pt is alert, awake, not in acute distress Cardiovascular: RRR, S1/S2 +, no rubs, no gallops Respiratory: CTA bilaterally, no wheezing, no rhonchi Abdominal: Soft, NT, ND, bowel sounds + Extremities: no edema, no cyanosis   The results of significant diagnostics from this hospitalization (including imaging, microbiology, ancillary and laboratory) are listed below for reference.     Microbiology: No results found for this or any previous visit (from the past 240 hour(s)).   Labs: BNP (last 3 results) No results for input(s): BNP in the last 8760 hours. Basic Metabolic Panel:  Recent Labs Lab 01/28/17 1744 01/28/17 1749 01/29/17 0442  NA 136 139 136  K 4.4 4.4 3.8  CL 103 101 104  CO2 26  --  26  GLUCOSE 117* 117* 104*  BUN 21* 26* 17  CREATININE 1.06 1.10 0.96  CALCIUM 9.0  --  8.3*   Liver Function Tests:  Recent Labs Lab 01/28/17 1744  AST 29  ALT 16*  ALKPHOS 47  BILITOT 1.0  PROT 5.8*  ALBUMIN 3.5   No results for input(s): LIPASE, AMYLASE in the last 168 hours. No results for input(s): AMMONIA in the last 168 hours. CBC:  Recent Labs Lab 01/28/17 1744 01/28/17 1749  WBC 8.5  --   NEUTROABS 7.4  --   HGB 12.5* 12.2*  HCT 36.7* 36.0*  MCV 84.8  --   PLT 186  --    Cardiac Enzymes: No results for input(s): CKTOTAL, CKMB, CKMBINDEX, TROPONINI in the last 168  hours. BNP: Invalid input(s): POCBNP CBG:  Recent Labs Lab 01/28/17 1907 02/01/17 0341 02/01/17 1115  GLUCAP 86 124* 108*   D-Dimer No results for input(s): DDIMER in the last 72 hours. Hgb A1c No results for input(s): HGBA1C in the last 72 hours. Lipid Profile No results for input(s): CHOL, HDL, LDLCALC, TRIG, CHOLHDL, LDLDIRECT in the last 72 hours. Thyroid function studies No results for input(s): TSH, T4TOTAL, T3FREE, THYROIDAB in the last 72  hours.  Invalid input(s): FREET3 Anemia work up No results for input(s): VITAMINB12, FOLATE, FERRITIN, TIBC, IRON, RETICCTPCT in the last 72 hours. Urinalysis No results found for: COLORURINE, APPEARANCEUR, LABSPEC, [place], GLUCOSEU, HGBUR, BILIRUBINUR, KETONESUR, PROTEINUR, UROBILINOGEN, NITRITE, LEUKOCYTESUR Sepsis Labs Invalid input(s): PROCALCITONIN,  WBC,  LACTICIDVEN Microbiology No results found for this or any previous visit (from the past 240 hour(s)).   SIGNED:   Donne Hazel, MD  [facility] 02/03/2017, 12:56 PM  If 7PM-7AM, please contact night-coverage [email or web address] Password [email or web address]

## 2017-02-03 NOTE — Progress Notes (Signed)
Pt being transferred to SNF per orders from MD. Pt and family aware of transfer. All questions and concerns were addressed. Pt's IV was removed prior to discharge. Pt exited hospital via stretcher. RN called [facility] and gave report to [place].

## 2017-02-03 NOTE — Progress Notes (Signed)
Called on call Dr. Hilbert Bible, patients bp was 168/94 and his goal bp is 160.  Dr. Hilbert Bible did not want to treat the bp at this time, to keep monitoring it and call if it goes into 180's.

## 2017-02-03 NOTE — Progress Notes (Signed)
Occupational Therapy Treatment Patient Details Name: Calvin Berry MRN: [id number] DOB: [birth year] Today's Date: 02/03/2017    History of present illness Calvin Berry is a 83 y.o. male with a past medical history significant for HTN not on medication, and hypothyroidism who presents with aphasia. Patient had an MRI which was of poor quality secondary to motion degradation but it demonstrates a lesion measuring approximately 2 cm in the left parietal region. There is a substantial amount of edema around this lesion and this appears to be most consistent with either a metastatic lesion or primary brain tumor.   OT comments  Pt progressing toward OT goals. Continues to require verbal and tactile cues to follow one-step commands. Pt able to complete standing grooming and toileting hygeine tasks with min assist this session. Also able to complete simulated toilet transfer with min assist. D/C plan remains appropriate. OT will continue to follow acutely.   Follow Up Recommendations  SNF;Supervision/Assistance - 24 hour    Equipment Recommendations  Other (comment) (TBD at next venue)    Recommendations for Other Services      Precautions / Restrictions Precautions Precautions: Fall Restrictions Weight Bearing Restrictions: No       Mobility Bed Mobility Overal bed mobility: Needs Assistance Bed Mobility: Supine to Sit;Sit to Supine     Supine to sit: Min guard Sit to supine: Min guard   General bed mobility comments: Max VC's to initiate and continue activities.   Transfers Overall transfer level: Needs assistance Equipment used: 1 person hand held assist Transfers: Sit to/from Stand Sit to Stand: Min assist              Balance Overall balance assessment: Needs assistance Sitting-balance support: Feet supported Sitting balance-Leahy Scale: Fair     Standing balance support: Single extremity supported;During functional activity Standing balance-Leahy Scale:  Fair Standing balance comment: Able to statically stand without UE support.                   ADL Overall ADL's : Needs assistance/impaired     Grooming: Minimal assistance;Standing;Cueing for sequencing Grooming Details (indicate cue type and reason): Verbal and tactile cues for sequencing and initiation of tasks. Requires repeated instructions. Upper Body Bathing: Minimal assistance;Sitting               Toilet Transfer: Minimal assistance;BSC Toilet Transfer Details (indicate cue type and reason): Min handheld assist for stability.  Toileting- Clothing Manipulation and Hygiene: Minimal assistance;Sit to/from stand Toileting - Clothing Manipulation Details (indicate cue type and reason): Min assist to maintain balance during task. Perseveration noted during this session with activities. Required multiple verbal/visual/tactile cues throughout. When asked to wash his face, pt washed his hands.     Functional mobility during ADLs: Minimal assistance;+2 for safety/equipment        Vision                 Additional Comments: Continues to appear impaired as pt requiring cues to attend to  L visual field.   Perception     Praxis      Cognition   Behavior During Therapy: Impulsive;Flat affect Overall Cognitive Status: Impaired/Different from baseline Area of Impairment: Memory;Following commands;Safety/judgement;Awareness;Problem solving;Attention;Orientation Orientation Level: Disoriented to;Time Current Attention Level: Sustained Memory: Decreased short-term memory  Following Commands: Follows one step commands inconsistently Safety/Judgement: Decreased awareness of safety;Decreased awareness of deficits Awareness: Intellectual Problem Solving: Requires verbal cues;Requires tactile cues General Comments: Easily redirected and clear at times during conversation. Reporting "  all the sounds are in my ears" throughout session.    Extremity/Trunk Assessment                Exercises     Shoulder Instructions       General Comments      Pertinent Vitals/ Pain       Pain Assessment: Faces Faces Pain Scale: No hurt  Home Living                                          Prior Functioning/Environment              Frequency  Min 2X/week        Progress Toward Goals  OT Goals(current goals can now be found in the care plan section)  Progress towards OT goals: Progressing toward goals  Acute Rehab OT Goals Patient Stated Goal: agreeable to getting OOB OT Goal Formulation: Patient unable to participate in goal setting Time For Goal Achievement: 02/15/17 Potential to Achieve Goals: Fair ADL Goals Pt Will Perform Eating: with supervision;sitting Pt Will Perform Grooming: with supervision;sitting Pt Will Transfer to Toilet: with min assist;ambulating;bedside commode Additional ADL Goal #1: Pt will attend to items in L visual field 75% of the time with min cues. Additional ADL Goal #2: Pt will follow one step command 100% of the time in a minimally distracting environment.  Plan Discharge plan remains appropriate    Co-evaluation                 End of Session Equipment Utilized During Treatment: Gait belt   Activity Tolerance Patient tolerated treatment well   Patient Left in bed;with call bell/phone within reach;with bed alarm set   Nurse Communication Mobility status        Time: [id number] OT Time Calculation (min): 22 min  Charges: OT General Charges $OT Visit: 1 Procedure OT Treatments $Self Care/Home Management : 8-22 mins  Norman Herrlich, OTR/L [phone] 02/03/2017, 1:15 PM

## 2017-02-04 DIAGNOSIS — C719 Malignant neoplasm of brain, unspecified: Secondary | ICD-10-CM | POA: Diagnosis not present

## 2017-02-04 DIAGNOSIS — R440 Auditory hallucinations: Secondary | ICD-10-CM | POA: Diagnosis not present

## 2017-02-04 DIAGNOSIS — R4701 Aphasia: Secondary | ICD-10-CM | POA: Diagnosis not present

## 2017-02-04 DIAGNOSIS — I10 Essential (primary) hypertension: Secondary | ICD-10-CM | POA: Diagnosis not present

## 2017-02-11 DIAGNOSIS — E039 Hypothyroidism, unspecified: Secondary | ICD-10-CM | POA: Diagnosis not present

## 2017-02-11 DIAGNOSIS — R4701 Aphasia: Secondary | ICD-10-CM | POA: Diagnosis not present

## 2017-02-11 DIAGNOSIS — C719 Malignant neoplasm of brain, unspecified: Secondary | ICD-10-CM | POA: Diagnosis not present

## 2017-02-11 DIAGNOSIS — I10 Essential (primary) hypertension: Secondary | ICD-10-CM | POA: Diagnosis not present

## 2017-02-12 DIAGNOSIS — Z8673 Personal history of transient ischemic attack (TIA), and cerebral infarction without residual deficits: Secondary | ICD-10-CM | POA: Diagnosis not present

## 2017-02-12 DIAGNOSIS — D508 Other iron deficiency anemias: Secondary | ICD-10-CM | POA: Diagnosis not present

## 2017-02-12 DIAGNOSIS — K254 Chronic or unspecified gastric ulcer with hemorrhage: Secondary | ICD-10-CM | POA: Diagnosis not present

## 2017-02-12 DIAGNOSIS — F419 Anxiety disorder, unspecified: Secondary | ICD-10-CM | POA: Diagnosis not present

## 2017-02-12 DIAGNOSIS — R109 Unspecified abdominal pain: Secondary | ICD-10-CM | POA: Diagnosis not present

## 2017-02-12 DIAGNOSIS — F0390 Unspecified dementia without behavioral disturbance: Secondary | ICD-10-CM | POA: Diagnosis not present

## 2017-02-12 DIAGNOSIS — Z8546 Personal history of malignant neoplasm of prostate: Secondary | ICD-10-CM | POA: Diagnosis not present

## 2017-02-12 DIAGNOSIS — Z86711 Personal history of pulmonary embolism: Secondary | ICD-10-CM | POA: Diagnosis not present

## 2017-02-12 DIAGNOSIS — R1013 Epigastric pain: Secondary | ICD-10-CM | POA: Diagnosis not present

## 2017-02-12 DIAGNOSIS — J189 Pneumonia, unspecified organism: Secondary | ICD-10-CM | POA: Diagnosis not present

## 2017-02-12 DIAGNOSIS — K921 Melena: Secondary | ICD-10-CM | POA: Diagnosis not present

## 2017-02-12 DIAGNOSIS — E871 Hypo-osmolality and hyponatremia: Secondary | ICD-10-CM | POA: Diagnosis not present

## 2017-02-12 DIAGNOSIS — K449 Diaphragmatic hernia without obstruction or gangrene: Secondary | ICD-10-CM | POA: Diagnosis not present

## 2017-02-12 DIAGNOSIS — D62 Acute posthemorrhagic anemia: Secondary | ICD-10-CM | POA: Diagnosis not present

## 2017-02-12 DIAGNOSIS — Z66 Do not resuscitate: Secondary | ICD-10-CM | POA: Diagnosis not present

## 2017-02-12 DIAGNOSIS — D496 Neoplasm of unspecified behavior of brain: Secondary | ICD-10-CM | POA: Diagnosis not present

## 2017-02-12 DIAGNOSIS — D649 Anemia, unspecified: Secondary | ICD-10-CM | POA: Diagnosis not present

## 2017-02-12 DIAGNOSIS — B3781 Candidal esophagitis: Secondary | ICD-10-CM | POA: Diagnosis not present

## 2017-02-12 DIAGNOSIS — E039 Hypothyroidism, unspecified: Secondary | ICD-10-CM | POA: Diagnosis not present

## 2017-02-12 DIAGNOSIS — K922 Gastrointestinal hemorrhage, unspecified: Secondary | ICD-10-CM | POA: Diagnosis not present

## 2017-02-12 DIAGNOSIS — Z86718 Personal history of other venous thrombosis and embolism: Secondary | ICD-10-CM | POA: Diagnosis not present

## 2017-02-12 DIAGNOSIS — I10 Essential (primary) hypertension: Secondary | ICD-10-CM | POA: Diagnosis not present

## 2017-02-12 DIAGNOSIS — Z7982 Long term (current) use of aspirin: Secondary | ICD-10-CM | POA: Diagnosis not present

## 2017-02-12 DIAGNOSIS — Z79899 Other long term (current) drug therapy: Secondary | ICD-10-CM | POA: Diagnosis not present

## 2017-02-13 ENCOUNTER — Other Ambulatory Visit: Payer: Self-pay

## 2017-02-13 DIAGNOSIS — K259 Gastric ulcer, unspecified as acute or chronic, without hemorrhage or perforation: Secondary | ICD-10-CM | POA: Diagnosis not present

## 2017-02-13 DIAGNOSIS — K921 Melena: Secondary | ICD-10-CM | POA: Diagnosis not present

## 2017-02-13 DIAGNOSIS — D62 Acute posthemorrhagic anemia: Secondary | ICD-10-CM | POA: Diagnosis not present

## 2017-02-13 NOTE — Patient Outreach (Signed)
Telephone assessment: New referral from [facility] on 02/13/2017  Placed a call to patients number- no answer Placed call to wife who states that patient is in the [facility] after seeing primary MD yesterday with a GI Bleed.   PLAN: Will plan to call patient back on Monday for follow up. Tomasa Rand, RN, BSN, CEN [facility] [employer] [phone]

## 2017-02-14 DIAGNOSIS — K259 Gastric ulcer, unspecified as acute or chronic, without hemorrhage or perforation: Secondary | ICD-10-CM | POA: Diagnosis not present

## 2017-02-14 DIAGNOSIS — D62 Acute posthemorrhagic anemia: Secondary | ICD-10-CM | POA: Diagnosis not present

## 2017-02-14 DIAGNOSIS — K921 Melena: Secondary | ICD-10-CM | POA: Diagnosis not present

## 2017-02-14 DIAGNOSIS — J189 Pneumonia, unspecified organism: Secondary | ICD-10-CM | POA: Diagnosis not present

## 2017-02-15 DIAGNOSIS — K259 Gastric ulcer, unspecified as acute or chronic, without hemorrhage or perforation: Secondary | ICD-10-CM | POA: Diagnosis not present

## 2017-02-15 DIAGNOSIS — D62 Acute posthemorrhagic anemia: Secondary | ICD-10-CM | POA: Diagnosis not present

## 2017-02-15 DIAGNOSIS — K921 Melena: Secondary | ICD-10-CM | POA: Diagnosis not present

## 2017-02-16 ENCOUNTER — Ambulatory Visit: Payer: Self-pay

## 2017-02-16 ENCOUNTER — Other Ambulatory Visit: Payer: Self-pay

## 2017-02-16 DIAGNOSIS — K921 Melena: Secondary | ICD-10-CM | POA: Diagnosis not present

## 2017-02-16 DIAGNOSIS — K259 Gastric ulcer, unspecified as acute or chronic, without hemorrhage or perforation: Secondary | ICD-10-CM | POA: Diagnosis not present

## 2017-02-16 DIAGNOSIS — D62 Acute posthemorrhagic anemia: Secondary | ICD-10-CM | POA: Diagnosis not present

## 2017-02-16 NOTE — Patient Outreach (Signed)
Telephone follow up: Placed call and spoke with wife who states that patient is still in the hospital with a bleeding ulcer and pneumonia.   PLAN: Will call wife back in 2-3 days to determine interest in program.  Tomasa Rand, RN, BSN, [facility] [employer] [phone]

## 2017-02-17 DIAGNOSIS — Z8673 Personal history of transient ischemic attack (TIA), and cerebral infarction without residual deficits: Secondary | ICD-10-CM | POA: Diagnosis not present

## 2017-02-17 DIAGNOSIS — D496 Neoplasm of unspecified behavior of brain: Secondary | ICD-10-CM | POA: Diagnosis not present

## 2017-02-17 DIAGNOSIS — J189 Pneumonia, unspecified organism: Secondary | ICD-10-CM | POA: Diagnosis not present

## 2017-02-17 DIAGNOSIS — F0390 Unspecified dementia without behavioral disturbance: Secondary | ICD-10-CM | POA: Diagnosis not present

## 2017-02-17 DIAGNOSIS — R1013 Epigastric pain: Secondary | ICD-10-CM | POA: Diagnosis not present

## 2017-02-17 DIAGNOSIS — D62 Acute posthemorrhagic anemia: Secondary | ICD-10-CM | POA: Diagnosis not present

## 2017-02-17 DIAGNOSIS — K922 Gastrointestinal hemorrhage, unspecified: Secondary | ICD-10-CM | POA: Diagnosis not present

## 2017-02-17 DIAGNOSIS — R5381 Other malaise: Secondary | ICD-10-CM | POA: Diagnosis not present

## 2017-02-17 DIAGNOSIS — E039 Hypothyroidism, unspecified: Secondary | ICD-10-CM | POA: Diagnosis not present

## 2017-02-19 ENCOUNTER — Other Ambulatory Visit: Payer: Self-pay

## 2017-02-19 NOTE — Patient Outreach (Signed)
Telephone assessment: Placed call to wife and she reports that patient is in short term rehab for 1 week.  PLAN: will call patient/wife back in 1 week to determine interest in [facility] program.  Tomasa Rand, RN, BSN, CEN [place] [facility] [phone]

## 2017-02-20 DIAGNOSIS — K922 Gastrointestinal hemorrhage, unspecified: Secondary | ICD-10-CM | POA: Diagnosis not present

## 2017-02-20 DIAGNOSIS — D62 Acute posthemorrhagic anemia: Secondary | ICD-10-CM | POA: Diagnosis not present

## 2017-02-20 DIAGNOSIS — R5381 Other malaise: Secondary | ICD-10-CM | POA: Diagnosis not present

## 2017-02-20 DIAGNOSIS — J189 Pneumonia, unspecified organism: Secondary | ICD-10-CM | POA: Diagnosis not present

## 2017-02-24 ENCOUNTER — Other Ambulatory Visit: Payer: Self-pay

## 2017-02-24 NOTE — Patient Outreach (Signed)
Telephone assessment:  Placed call to patients wife to inquire about nursing home discharge. Wife reports that patient is still at [facility] but is with her at her home for a few hours this afternoon. She reports that patient is sleeping.  Wife states that patient will discharge to her home tomorrow (02/25/2017) Has follow up with primary MD on 3/1.    PLAN: I informed wife that I would call on 3/1 to determine patients interest in [facility] program and she agreed to phone call.  Tomasa Rand, RN, BSN, CEN [facility] [employer] [phone]

## 2017-02-26 ENCOUNTER — Other Ambulatory Visit: Payer: Self-pay

## 2017-02-26 DIAGNOSIS — E871 Hypo-osmolality and hyponatremia: Secondary | ICD-10-CM | POA: Diagnosis not present

## 2017-02-26 DIAGNOSIS — E782 Mixed hyperlipidemia: Secondary | ICD-10-CM | POA: Diagnosis not present

## 2017-02-26 DIAGNOSIS — N401 Enlarged prostate with lower urinary tract symptoms: Secondary | ICD-10-CM | POA: Diagnosis not present

## 2017-02-26 DIAGNOSIS — D508 Other iron deficiency anemias: Secondary | ICD-10-CM | POA: Diagnosis not present

## 2017-02-26 DIAGNOSIS — D496 Neoplasm of unspecified behavior of brain: Secondary | ICD-10-CM | POA: Diagnosis not present

## 2017-02-26 DIAGNOSIS — M159 Polyosteoarthritis, unspecified: Secondary | ICD-10-CM | POA: Diagnosis not present

## 2017-02-26 DIAGNOSIS — Z0000 Encounter for general adult medical examination without abnormal findings: Secondary | ICD-10-CM | POA: Diagnosis not present

## 2017-02-26 DIAGNOSIS — K921 Melena: Secondary | ICD-10-CM | POA: Diagnosis not present

## 2017-02-26 DIAGNOSIS — I10 Essential (primary) hypertension: Secondary | ICD-10-CM | POA: Diagnosis not present

## 2017-02-26 DIAGNOSIS — F419 Anxiety disorder, unspecified: Secondary | ICD-10-CM | POA: Diagnosis not present

## 2017-02-26 DIAGNOSIS — K922 Gastrointestinal hemorrhage, unspecified: Secondary | ICD-10-CM | POA: Diagnosis not present

## 2017-02-26 DIAGNOSIS — B370 Candidal stomatitis: Secondary | ICD-10-CM | POA: Diagnosis not present

## 2017-02-26 DIAGNOSIS — Z79899 Other long term (current) drug therapy: Secondary | ICD-10-CM | POA: Diagnosis not present

## 2017-02-26 DIAGNOSIS — E039 Hypothyroidism, unspecified: Secondary | ICD-10-CM | POA: Diagnosis not present

## 2017-02-26 NOTE — Patient Outreach (Signed)
Telephone call: Initial referral came from [facility].  I have spoken on and off for 2 weeks to wife.  Finally able to speak with patient today about [facility] services. Husband request I speak with wife about everything.    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 Patient has now moved into her house at  [address] [place]  [address].  Wife states that patient saw primary MD today.  Wife declines services at this time. She did agree to me sending her a letter if she needs help in the future.   PLAN: declines services Will send notification to [facility] care management assistant.   Screening not completed as wife states she is headed to drug store. Again declines [facility] program.  Tomasa Rand, RN, BSN, CEN [facility] [employer] [phone]

## 2017-03-29 IMAGING — CT CT CHEST W/ CM
2 of 5 series · 7 of 36 positions shown, 8 images · IV contrast (Iodine)
Comparison: None.

CLINICAL DATA: Brain mass.

EXAM:
CT CHEST, ABDOMEN, AND PELVIS WITH CONTRAST
TECHNIQUE: Multidetector CT imaging of the chest, abdomen and pelvis was
performed following the standard protocol during bolus
administration of intravenous contrast.
CONTRAST:  100mL SAHCMI-URR IOPAMIDOL (SAHCMI-URR) INJECTION 61%

[Series 301: cap with, idose (2) · axial · 0.75mm/px · z∈[+596,+1021]mm · 4 of 129 slices shown, 5 images]
[im 22/129  mediastinal]
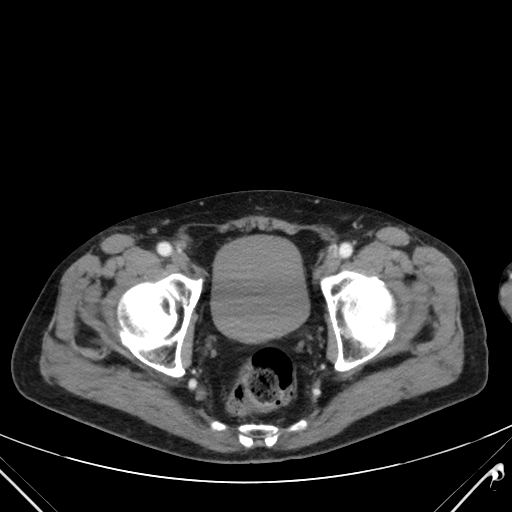
[im 22/129  lung]
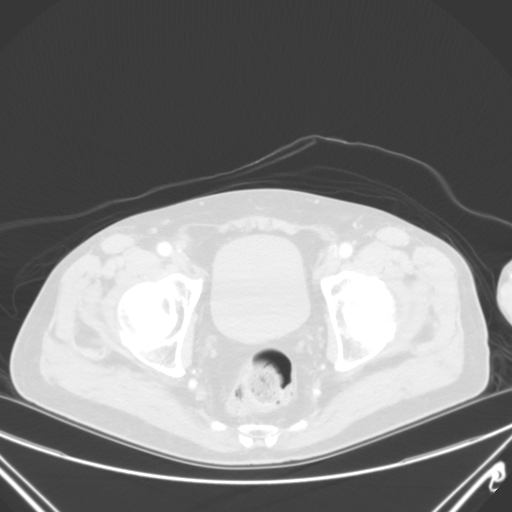
[im 54/129  lung]
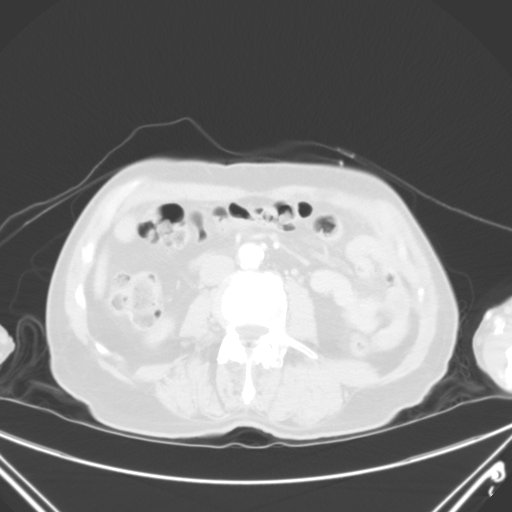
[im 75/129  lung]
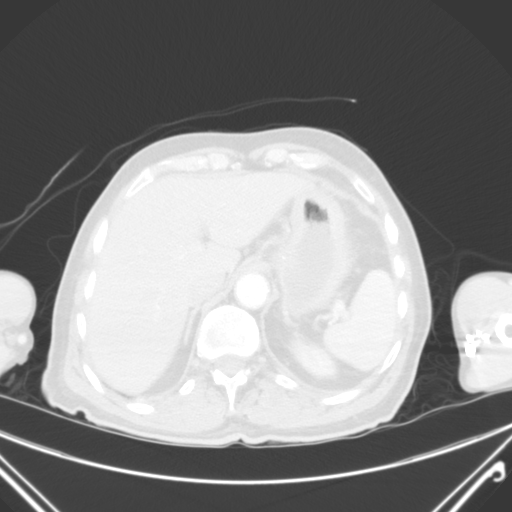
[im 107/129  lung]
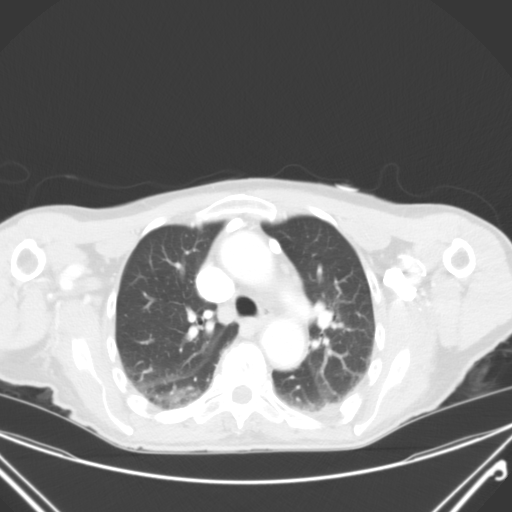

[Series 303: coronals, idose (2) · coronal · 0.45mm/px · 3 of 118 slices shown]
[im 24/118  lung]
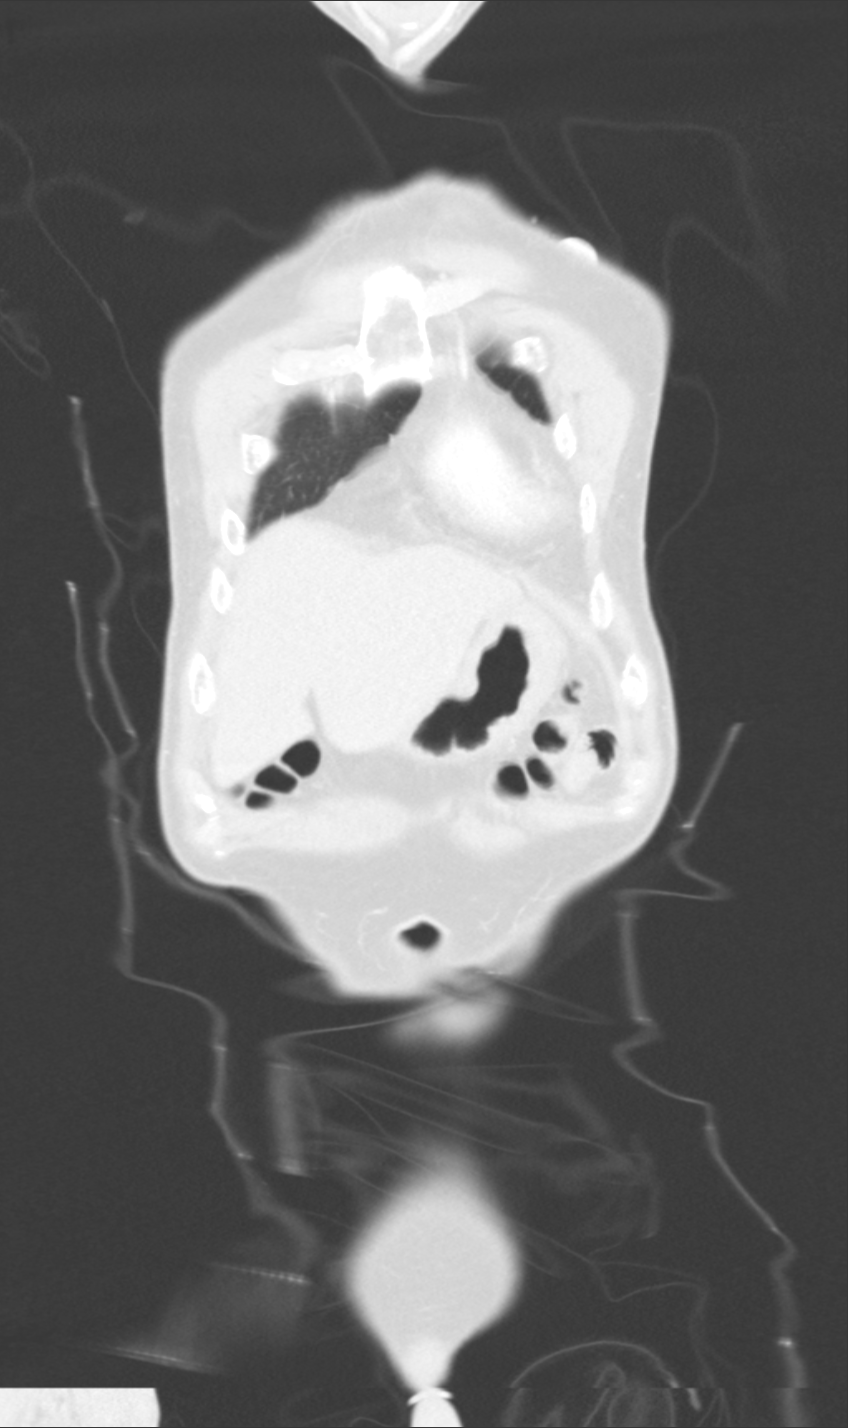
[im 47/118  lung]
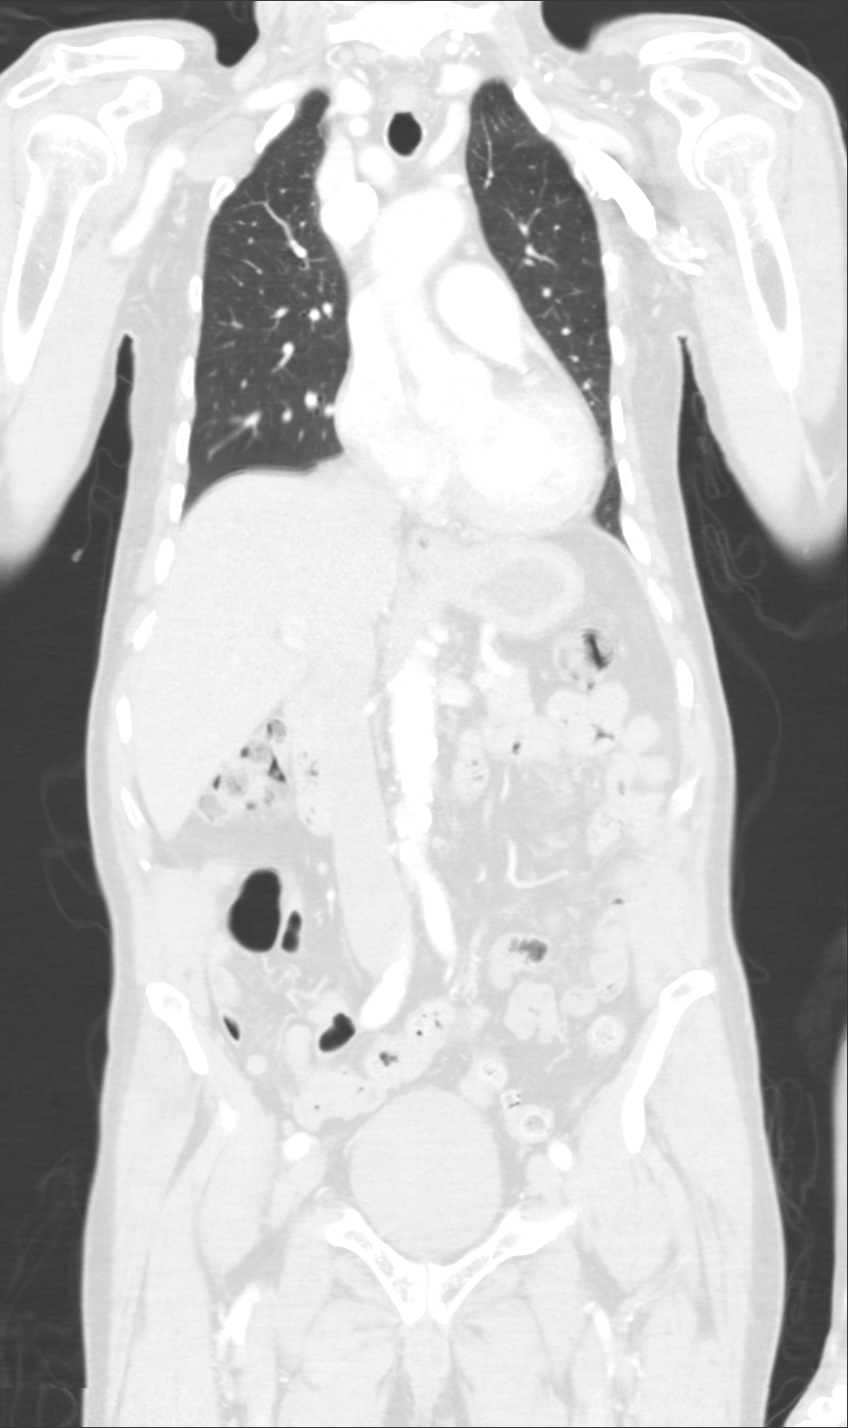
[im 71/118  lung]
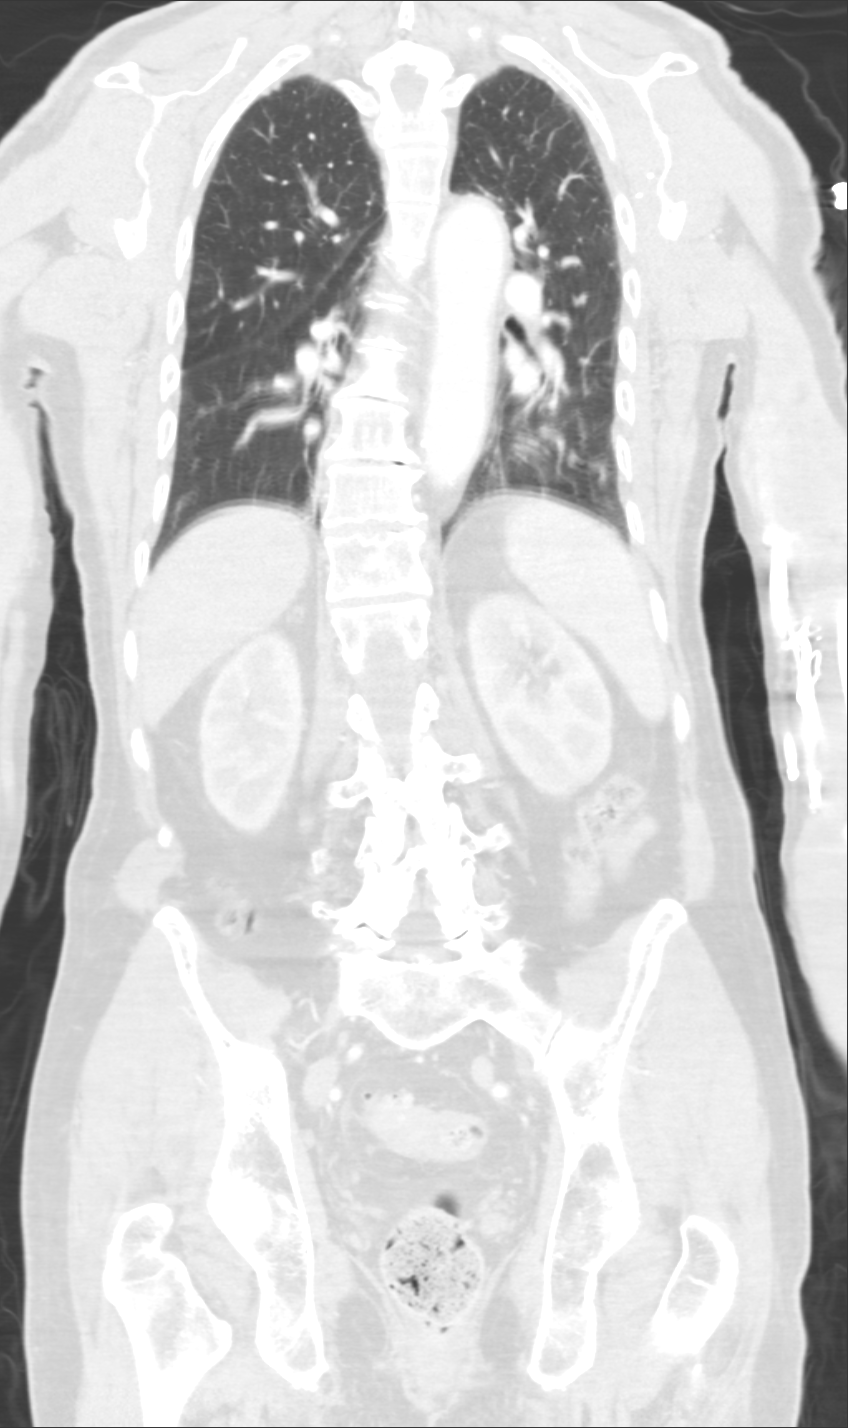

[7 of 36 positions shown; findings below may reference images not displayed]

FINDINGS: CT CHEST FINDINGS

Cardiovascular: Normal heart size. Aortic atherosclerosis.
Calcification within the LAD coronary artery noted.

Mediastinum/Nodes: The trachea appears patent and is midline. Normal
appearance of the esophagus. No mediastinal or hilar adenopathy. No
axillary adenopathy.

Lungs/Pleura: Dependent changes and subsegmental atelectasis are
noted within bilateral posterior lung bases. Small nodule in the
right apex measures 4 mm, image 13 of series 205. 3 mm right upper
lobe lung nodule is identified, image 47 of series 205.

Musculoskeletal: Scoliosis and degenerative disc disease noted.
Chronic left posterior rib fracture deformities are noted. No
aggressive lytic or sclerotic bone lesions.

CT ABDOMEN PELVIS FINDINGS

Hepatobiliary: 2 cm cyst within right lobe of liver noted, image 48
of series 301. Vicarious excretion of contrast into the gallbladder
noted. No biliary dilatation.

Pancreas: Unremarkable. No pancreatic ductal dilatation or
surrounding inflammatory changes.

Spleen: Normal in size without focal abnormality.

Adrenals/Urinary Tract: Normal adrenal glands. Nonobstructing
calculus within the inferior pole the right kidney measures 4 mm,
image 71 of series 301. The left kidney appears normal. Bladder
appears normal.

Stomach/Bowel: The stomach appears normal. The small bowel loops
have a normal course and caliber. Distal colonic diverticula noted
without acute inflammation.

Vascular/Lymphatic: Aortic atherosclerosis. No enlarged abdominal or
pelvic lymph nodes.

Reproductive: Fiducial markers noted within the prostate gland.

Other: No abdominal wall hernia or abnormality. No abdominopelvic
ascites.

Musculoskeletal: Nonaggressive appearing sclerotic lesion within the
right iliac bone is identified, image number 91 of series 301. No
aggressive lytic or sclerotic bone lesions identified. Degenerative
disc disease is identified at L4-5 and L5-S1.
IMPRESSION: 1. No acute findings identified within the chest, abdomen or pelvis.
No mass or adenopathy identified.
2. Small pulmonary nodules are identified within the right lung. No
follow-up needed if patient is low-risk (and has no known or
suspected primary neoplasm). Non-contrast chest CT can be considered
in 12 months if patient is high-risk. This recommendation follows
the consensus statement: Guidelines for Management of Incidental
Pulmonary Nodules Detected on CT Images: From the [HOSPITAL]
3. Aortic atherosclerosis and coronary artery calcification
4. Right renal calculus.

## 2017-04-07 DIAGNOSIS — K279 Peptic ulcer, site unspecified, unspecified as acute or chronic, without hemorrhage or perforation: Secondary | ICD-10-CM | POA: Diagnosis not present

## 2017-04-07 DIAGNOSIS — J189 Pneumonia, unspecified organism: Secondary | ICD-10-CM | POA: Diagnosis not present

## 2017-04-07 DIAGNOSIS — R05 Cough: Secondary | ICD-10-CM | POA: Diagnosis not present

## 2017-04-07 DIAGNOSIS — E039 Hypothyroidism, unspecified: Secondary | ICD-10-CM | POA: Diagnosis not present

## 2017-04-07 DIAGNOSIS — E46 Unspecified protein-calorie malnutrition: Secondary | ICD-10-CM | POA: Diagnosis not present

## 2017-04-07 DIAGNOSIS — Z79899 Other long term (current) drug therapy: Secondary | ICD-10-CM | POA: Diagnosis not present

## 2017-04-07 DIAGNOSIS — D496 Neoplasm of unspecified behavior of brain: Secondary | ICD-10-CM | POA: Diagnosis not present

## 2017-04-07 DIAGNOSIS — E871 Hypo-osmolality and hyponatremia: Secondary | ICD-10-CM | POA: Diagnosis not present

## 2017-04-07 DIAGNOSIS — R0989 Other specified symptoms and signs involving the circulatory and respiratory systems: Secondary | ICD-10-CM | POA: Diagnosis not present

## 2017-04-07 DIAGNOSIS — R609 Edema, unspecified: Secondary | ICD-10-CM | POA: Diagnosis not present

## 2017-04-08 ENCOUNTER — Encounter: Payer: Self-pay | Admitting: Neurology

## 2017-04-12 DIAGNOSIS — G939 Disorder of brain, unspecified: Secondary | ICD-10-CM | POA: Diagnosis not present

## 2017-04-12 DIAGNOSIS — E039 Hypothyroidism, unspecified: Secondary | ICD-10-CM | POA: Diagnosis not present

## 2017-04-12 DIAGNOSIS — F418 Other specified anxiety disorders: Secondary | ICD-10-CM | POA: Diagnosis not present

## 2017-04-12 DIAGNOSIS — Z86711 Personal history of pulmonary embolism: Secondary | ICD-10-CM | POA: Diagnosis not present

## 2017-04-12 DIAGNOSIS — I6789 Other cerebrovascular disease: Secondary | ICD-10-CM | POA: Diagnosis not present

## 2017-04-12 DIAGNOSIS — J9601 Acute respiratory failure with hypoxia: Secondary | ICD-10-CM | POA: Diagnosis not present

## 2017-04-12 DIAGNOSIS — K219 Gastro-esophageal reflux disease without esophagitis: Secondary | ICD-10-CM | POA: Diagnosis not present

## 2017-04-12 DIAGNOSIS — A419 Sepsis, unspecified organism: Secondary | ICD-10-CM | POA: Diagnosis not present

## 2017-04-12 DIAGNOSIS — Z66 Do not resuscitate: Secondary | ICD-10-CM | POA: Diagnosis not present

## 2017-04-12 DIAGNOSIS — Z8546 Personal history of malignant neoplasm of prostate: Secondary | ICD-10-CM | POA: Diagnosis not present

## 2017-04-12 DIAGNOSIS — K279 Peptic ulcer, site unspecified, unspecified as acute or chronic, without hemorrhage or perforation: Secondary | ICD-10-CM | POA: Diagnosis not present

## 2017-04-12 DIAGNOSIS — G9341 Metabolic encephalopathy: Secondary | ICD-10-CM | POA: Diagnosis not present

## 2017-04-12 DIAGNOSIS — J80 Acute respiratory distress syndrome: Secondary | ICD-10-CM | POA: Diagnosis not present

## 2017-04-12 DIAGNOSIS — J156 Pneumonia due to other aerobic Gram-negative bacteria: Secondary | ICD-10-CM | POA: Diagnosis not present

## 2017-04-12 DIAGNOSIS — Z8673 Personal history of transient ischemic attack (TIA), and cerebral infarction without residual deficits: Secondary | ICD-10-CM | POA: Diagnosis not present

## 2017-04-12 DIAGNOSIS — I10 Essential (primary) hypertension: Secondary | ICD-10-CM | POA: Diagnosis not present

## 2017-04-12 DIAGNOSIS — F0390 Unspecified dementia without behavioral disturbance: Secondary | ICD-10-CM | POA: Diagnosis not present

## 2017-04-12 DIAGNOSIS — D638 Anemia in other chronic diseases classified elsewhere: Secondary | ICD-10-CM | POA: Diagnosis not present

## 2017-04-12 DIAGNOSIS — C713 Malignant neoplasm of parietal lobe: Secondary | ICD-10-CM | POA: Diagnosis not present

## 2017-04-12 DIAGNOSIS — F4489 Other dissociative and conversion disorders: Secondary | ICD-10-CM | POA: Diagnosis not present

## 2017-04-12 DIAGNOSIS — E785 Hyperlipidemia, unspecified: Secondary | ICD-10-CM | POA: Diagnosis not present

## 2017-04-12 DIAGNOSIS — R410 Disorientation, unspecified: Secondary | ICD-10-CM | POA: Diagnosis not present

## 2017-04-12 DIAGNOSIS — E871 Hypo-osmolality and hyponatremia: Secondary | ICD-10-CM | POA: Diagnosis not present

## 2017-04-12 DIAGNOSIS — J189 Pneumonia, unspecified organism: Secondary | ICD-10-CM | POA: Diagnosis not present

## 2017-04-13 DIAGNOSIS — R0602 Shortness of breath: Secondary | ICD-10-CM | POA: Diagnosis not present

## 2017-04-24 ENCOUNTER — Ambulatory Visit: Payer: PPO | Admitting: Neurology

## 2017-04-28 DEATH — deceased

## 2023-06-24 ENCOUNTER — Other Ambulatory Visit: Payer: Self-pay
